# Patient Record
Sex: Female | Born: 1965 | Race: White | Hispanic: No | Marital: Married | Smoking: Never smoker
Health system: Southern US, Community
[De-identification: ages and names within clinical notes are randomized; demographics above are authoritative.]

## PROBLEM LIST (undated history)

## (undated) DIAGNOSIS — C4491 Basal cell carcinoma of skin, unspecified: Secondary | ICD-10-CM

## (undated) DIAGNOSIS — Z9889 Other specified postprocedural states: Secondary | ICD-10-CM

## (undated) DIAGNOSIS — F329 Major depressive disorder, single episode, unspecified: Secondary | ICD-10-CM

## (undated) DIAGNOSIS — R112 Nausea with vomiting, unspecified: Secondary | ICD-10-CM

## (undated) DIAGNOSIS — F32A Depression, unspecified: Secondary | ICD-10-CM

## (undated) DIAGNOSIS — F419 Anxiety disorder, unspecified: Secondary | ICD-10-CM

## (undated) HISTORY — PX: BLADDER REPAIR: SHX76

## (undated) HISTORY — DX: Anxiety disorder, unspecified: F41.9

## (undated) HISTORY — DX: Major depressive disorder, single episode, unspecified: F32.9

## (undated) HISTORY — PX: TUBAL LIGATION: SHX77

## (undated) HISTORY — DX: Depression, unspecified: F32.A

## (undated) HISTORY — PX: LAPAROSCOPIC OVARIAN CYSTECTOMY: SUR786

## (undated) HISTORY — PX: ABLATION: SHX5711

## (undated) SURGERY — Surgical Case
Anesthesia: *Unknown

---
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
## 2003-02-14 ENCOUNTER — Other Ambulatory Visit: Admission: RE | Admit: 2003-02-14 | Discharge: 2003-02-14 | Payer: Self-pay | Admitting: Obstetrics and Gynecology

## 2003-03-11 ENCOUNTER — Ambulatory Visit (HOSPITAL_BASED_OUTPATIENT_CLINIC_OR_DEPARTMENT_OTHER): Admission: RE | Admit: 2003-03-11 | Discharge: 2003-03-11 | Payer: Self-pay | Admitting: Obstetrics and Gynecology

## 2004-05-25 ENCOUNTER — Ambulatory Visit: Payer: Self-pay | Admitting: Internal Medicine

## 2004-07-11 ENCOUNTER — Other Ambulatory Visit: Admission: RE | Admit: 2004-07-11 | Discharge: 2004-07-11 | Payer: Self-pay | Admitting: Obstetrics and Gynecology

## 2004-07-31 ENCOUNTER — Encounter (INDEPENDENT_AMBULATORY_CARE_PROVIDER_SITE_OTHER): Payer: Self-pay | Admitting: Specialist

## 2004-07-31 ENCOUNTER — Ambulatory Visit (HOSPITAL_COMMUNITY): Admission: RE | Admit: 2004-07-31 | Discharge: 2004-07-31 | Payer: Self-pay | Admitting: Obstetrics and Gynecology

## 2004-10-03 ENCOUNTER — Ambulatory Visit: Payer: Self-pay | Admitting: Internal Medicine

## 2004-10-09 ENCOUNTER — Ambulatory Visit: Payer: Self-pay | Admitting: Internal Medicine

## 2005-03-14 ENCOUNTER — Ambulatory Visit: Payer: Self-pay | Admitting: Internal Medicine

## 2005-04-15 ENCOUNTER — Ambulatory Visit: Payer: Self-pay | Admitting: Internal Medicine

## 2006-04-09 ENCOUNTER — Encounter: Payer: Self-pay | Admitting: Internal Medicine

## 2008-07-25 ENCOUNTER — Ambulatory Visit: Payer: Self-pay | Admitting: Internal Medicine

## 2008-07-26 ENCOUNTER — Telehealth: Payer: Self-pay | Admitting: Internal Medicine

## 2009-04-05 ENCOUNTER — Ambulatory Visit: Payer: Self-pay | Admitting: Internal Medicine

## 2009-05-17 ENCOUNTER — Ambulatory Visit: Payer: Self-pay | Admitting: Internal Medicine

## 2009-05-18 ENCOUNTER — Encounter: Payer: Self-pay | Admitting: Internal Medicine

## 2009-05-22 ENCOUNTER — Telehealth: Payer: Self-pay | Admitting: Internal Medicine

## 2009-05-31 ENCOUNTER — Encounter: Payer: Self-pay | Admitting: Internal Medicine

## 2009-06-28 ENCOUNTER — Telehealth: Payer: Self-pay | Admitting: Internal Medicine

## 2009-08-15 LAB — HM MAMMOGRAPHY: HM Mammogram: NORMAL

## 2009-08-15 LAB — CONVERTED CEMR LAB: Pap Smear: NORMAL

## 2009-08-23 ENCOUNTER — Encounter: Payer: Self-pay | Admitting: Internal Medicine

## 2009-10-17 ENCOUNTER — Ambulatory Visit: Payer: Self-pay | Admitting: Internal Medicine

## 2010-03-16 ENCOUNTER — Ambulatory Visit: Payer: Self-pay | Admitting: Internal Medicine

## 2010-04-05 ENCOUNTER — Telehealth: Payer: Self-pay | Admitting: Internal Medicine

## 2010-04-13 ENCOUNTER — Ambulatory Visit: Payer: Self-pay | Admitting: Internal Medicine

## 2010-04-13 LAB — CONVERTED CEMR LAB
ALT: 12 units/L (ref 0–35)
AST: 15 units/L (ref 0–37)
Albumin: 4.2 g/dL (ref 3.5–5.2)
Alkaline Phosphatase: 50 units/L (ref 39–117)
BUN: 15 mg/dL (ref 6–23)
Basophils Absolute: 0 10*3/uL (ref 0.0–0.1)
Basophils Relative: 0.6 % (ref 0.0–3.0)
Bilirubin Urine: NEGATIVE
Bilirubin, Direct: 0 mg/dL (ref 0.0–0.3)
Blood in Urine, dipstick: NEGATIVE
CO2: 23 meq/L (ref 19–32)
Calcium: 8.9 mg/dL (ref 8.4–10.5)
Chloride: 111 meq/L (ref 96–112)
Cholesterol: 201 mg/dL — ABNORMAL HIGH (ref 0–200)
Creatinine, Ser: 0.8 mg/dL (ref 0.4–1.2)
Direct LDL: 135.1 mg/dL
Eosinophils Absolute: 0 10*3/uL (ref 0.0–0.7)
Eosinophils Relative: 1.1 % (ref 0.0–5.0)
GFR calc non Af Amer: 85.8 mL/min (ref >60)
Glucose, Bld: 78 mg/dL (ref 70–99)
Glucose, Urine, Semiquant: NEGATIVE
HCT: 35.9 % — ABNORMAL LOW (ref 36.0–46.0)
HDL: 47.7 mg/dL (ref >39.00)
Hemoglobin: 12.4 g/dL (ref 12.0–15.0)
Ketones, urine, test strip: NEGATIVE
Lymphocytes Relative: 36.6 % (ref 12.0–46.0)
Lymphs Abs: 1.6 10*3/uL (ref 0.7–4.0)
MCHC: 34.7 g/dL (ref 30.0–36.0)
MCV: 92.1 fL (ref 78.0–100.0)
Monocytes Absolute: 0.2 10*3/uL (ref 0.1–1.0)
Monocytes Relative: 4.8 % (ref 3.0–12.0)
Neutro Abs: 2.5 10*3/uL (ref 1.4–7.7)
Neutrophils Relative %: 56.9 % (ref 43.0–77.0)
Nitrite: NEGATIVE
Platelets: 303 10*3/uL (ref 150.0–400.0)
Potassium: 3.9 meq/L (ref 3.5–5.1)
Protein, U semiquant: NEGATIVE
RBC: 3.9 M/uL (ref 3.87–5.11)
RDW: 13.2 % (ref 11.5–14.6)
Sodium: 139 meq/L (ref 135–145)
Specific Gravity, Urine: 1.01
TSH: 1.58 microintl units/mL (ref 0.35–5.50)
Total Bilirubin: 0.6 mg/dL (ref 0.3–1.2)
Total CHOL/HDL Ratio: 4
Total Protein: 7 g/dL (ref 6.0–8.3)
Triglycerides: 63 mg/dL (ref 0.0–149.0)
Urobilinogen, UA: 0.2
VLDL: 12.6 mg/dL (ref 0.0–40.0)
WBC Urine, dipstick: NEGATIVE
WBC: 4.3 10*3/uL — ABNORMAL LOW (ref 4.5–10.5)
pH: 5.5

## 2010-04-20 ENCOUNTER — Ambulatory Visit: Payer: Self-pay | Admitting: Internal Medicine

## 2010-04-20 DIAGNOSIS — G43909 Migraine, unspecified, not intractable, without status migrainosus: Secondary | ICD-10-CM | POA: Insufficient documentation

## 2010-07-02 LAB — CBC
HCT: 36.8 % (ref 36.0–46.0)
Hemoglobin: 12.6 g/dL (ref 12.0–15.0)
MCH: 30.3 pg (ref 26.0–34.0)
MCHC: 34.2 g/dL (ref 30.0–36.0)
MCV: 88.5 fL (ref 78.0–100.0)
Platelets: 292 10*3/uL (ref 150–400)
RBC: 4.16 MIL/uL (ref 3.87–5.11)
RDW: 12.5 % (ref 11.5–15.5)
WBC: 6.8 10*3/uL (ref 4.0–10.5)

## 2010-07-03 ENCOUNTER — Ambulatory Visit (HOSPITAL_COMMUNITY)
Admission: RE | Admit: 2010-07-03 | Discharge: 2010-07-03 | Payer: Self-pay | Source: Home / Self Care | Attending: Obstetrics and Gynecology | Admitting: Obstetrics and Gynecology

## 2010-07-04 LAB — PREGNANCY, URINE: Preg Test, Ur: NEGATIVE

## 2010-07-17 NOTE — Assessment & Plan Note (Signed)
Summary: cpx/mm   Vital Signs:  Patient profile:   45 year old female Height:      63 inches Weight:      137 pounds Temp:     98.4 degrees F oral Pulse rate:   72 / minute Pulse rhythm:   regular BP sitting:   112 / 74  (left arm) Cuff size:   regular  Vitals Entered By: Alfred Levins, CMA (April 20, 2010 8:11 AM) CC: cpx no pap   CC:  cpx no pap.  History of Present Illness: CPX  Current Medications (verified): 1)  Maxalt 10 Mg  Tabs (Rizatriptan Benzoate) .... Once Daily As Needed Migraine 2)  Zyrtec Allergy 10 Mg Tabs (Cetirizine Hcl) .... Once Daily 3)  Topiramate 100 Mg Tabs (Topiramate) .Marland Kitchen.. 1 By Mouth Two Times A Day 4)  Elmiron 100 Mg Caps (Pentosan Polysulfate Sodium) .... As Directed Per Dr Dareen Piano 5)  Hydrocodone-Acetaminophen 5-325 Mg Tabs (Hydrocodone-Acetaminophen) .Marland Kitchen.. 1 By Mouth Up To 4 Times Per Day As Needed For Pain 6)  Treximet 85-500 Mg  Tabs (Sumatriptan-Naproxen Sodium) .Marland Kitchen.. 1 Once Daily As Needed Migraine  Allergies (verified): No Known Drug Allergies  Past History:  Past Medical History: Last updated: 03/19/2007 MHA Anxiety Depression Tiredness Headache  Past Surgical History: Last updated: 03/19/2007 Bladder Repair Tubal ligation  Family History: Last updated: 03/19/2007 Family History of MS Family Hsitory Headaches Family History Hypertension Family History of Melanoma Family History Thyroid disease  Social History: Last updated: 03/19/2007 Occupation: Married Never Smoked Alcohol use-yes  Risk Factors: Smoking Status: never (10/17/2009)  Physical Exam  General:  alert and well-developed.   Head:  normocephalic and atraumatic.   Eyes:  pupils equal and pupils round.   Ears:  R ear normal and L ear normal.   Neck:  No deformities, masses, or tenderness noted. Chest Wall:  No deformities, masses, or tenderness noted. Lungs:  Normal respiratory effort, chest expands symmetrically. Lungs are clear to auscultation,  no crackles or wheezes. Heart:  normal rate and regular rhythm.   Abdomen:  soft and non-tender.   Msk:  No deformity or scoliosis noted of thoracic or lumbar spine.   Pulses:  R radial normal and L radial normal.   Neurologic:  alert & oriented X3 and cranial nerves II-XII intact.   Skin:  turgor normal and color normal.   Psych:  good eye contact and not anxious appearing.     Impression & Recommendations:  Problem # 1:  PREVENTIVE HEALTH CARE (ICD-V70.0)  health maint utd continue excellent health habilts  Problem # 2:  MIGRAINE HEADACHE (ICD-346.90)  The following medications were removed from the medication list:    Maxalt 10 Mg Tabs (Rizatriptan benzoate) ..... Once daily as needed migraine Her updated medication list for this problem includes:    Hydrocodone-acetaminophen 5-325 Mg Tabs (Hydrocodone-acetaminophen) .Marland Kitchen... 1 by mouth up to 4 times per day as needed for pain    Treximet 85-500 Mg Tabs (Sumatriptan-naproxen sodium) .Marland Kitchen... 1 once daily as needed migraine  Complete Medication List: 1)  Zyrtec Allergy 10 Mg Tabs (Cetirizine hcl) .... Once daily 2)  Topiramate 100 Mg Tabs (Topiramate) .Marland Kitchen.. 1 by mouth two times a day 3)  Elmiron 100 Mg Caps (Pentosan polysulfate sodium) .... As directed per dr Dareen Piano 4)  Hydrocodone-acetaminophen 5-325 Mg Tabs (Hydrocodone-acetaminophen) .Marland Kitchen.. 1 by mouth up to 4 times per day as needed for pain 5)  Treximet 85-500 Mg Tabs (Sumatriptan-naproxen sodium) .Marland Kitchen.. 1 once daily as needed  migraine Prescriptions: TOPIRAMATE 100 MG TABS (TOPIRAMATE) 1 by mouth two times a day  #180 x 3   Entered and Authorized by:   Birdie Sons MD   Signed by:   Birdie Sons MD on 04/20/2010   Method used:   [facility]        [facility] [address] [phone]* (retail)       [address]       [place], [place]  [address]       Ph: [phone] or [phone]       Fax: [phone]   RxID:   [id number]    Orders Added: 1)  Est. Patient 40-64 years  [99396]     Preventive Care Screening  Mammogram:    Next Due:  08/2011  Pap Smear:    Date:  08/15/2009    Next Due:  08/2012    Results:  normal

## 2010-07-17 NOTE — Assessment & Plan Note (Signed)
Summary: 6 month rov/njr/pt rsc from bmp/cjr   Vital Signs:  Patient profile:   45 year old female Height:      64 inches Weight:      141 pounds BMI:     24.29 Temp:     98.5 degrees F oral Pulse rate:   62 / minute Pulse rhythm:   regular Resp:     12 per minute BP sitting:   108 / 68  (left arm) Cuff size:   regular  Vitals Entered By: Gladis Riffle, RN (Oct 17, 2009 1:16 PM) CC: 6 month rov Is Patient Diabetic? No   CC:  6 month rov.  History of Present Illness: migraines MUCH better on topomax has only had 2 migraines since being on meds relpax with good results All other systems reviewed and were negative and no side effects on meds   Preventive Screening-Counseling & Management  Alcohol-Tobacco     Smoking Status: never  Current Medications (verified): 1)  Relpax 40 Mg Tabs (Eletriptan Hydrobromide) .... One Tablet As Needed Migraine.  May Repeat Once After 2 Hours As Needed But Max of 2 Tablets 2)  Zyrtec Allergy 10 Mg Tabs (Cetirizine Hcl) .... Once Daily 3)  Topiramate 25 Mg Cpsp (Topiramate) .... Take 1 Tablet By Mouth Two Times A Day  Allergies (verified): No Known Drug Allergies  Past History:  Past Medical History: Last updated: 03/19/2007 MHA Anxiety Depression Tiredness Headache  Past Surgical History: Last updated: 03/19/2007 Bladder Repair Tubal ligation  Family History: Last updated: 03/19/2007 Family History of MS Family Hsitory Headaches Family History Hypertension Family History of Melanoma Family History Thyroid disease  Social History: Last updated: 03/19/2007 Occupation: Married Never Smoked Alcohol use-yes  Risk Factors: Smoking Status: never (10/17/2009)  Physical Exam  General:  alert and well-developed.   Head:  atraumatic.   Eyes:  pupils equal and pupils round.   Neck:  No deformities, masses, or tenderness noted. Lungs:  Normal respiratory effort, chest expands symmetrically. Lungs are clear to  auscultation, no crackles or wheezes.   Impression & Recommendations:  Problem # 1:  HEADACHE (ICD-784.0)  migraines much better on topomax continue current medications  Her updated medication list for this problem includes:    Relpax 40 Mg Tabs (Eletriptan hydrobromide) ..... One tablet as needed migraine.  may repeat once after 2 hours as needed but max of 2 tablets  Her updated medication list for this problem includes:    Relpax 40 Mg Tabs (Eletriptan hydrobromide) ..... One tablet as needed migraine.  may repeat once after 2 hours as needed but max of 2 tablets  Complete Medication List: 1)  Relpax 40 Mg Tabs (Eletriptan hydrobromide) .... One tablet as needed migraine.  may repeat once after 2 hours as needed but max of 2 tablets 2)  Zyrtec Allergy 10 Mg Tabs (Cetirizine hcl) .... Once daily 3)  Topiramate 25 Mg Cpsp (Topiramate) .... Take 1 tablet by mouth two times a day  Other Orders: Tdap => 61yrs IM (01601) Admin 1st Vaccine (09323)  Patient Instructions: 1)  Please schedule a follow-up appointment in 6 months. CPX   Immunizations Administered:  Tetanus Vaccine:    Vaccine Type: Tdap    Site: left deltoid    Mfr: GlaxoSmithKline    Dose: 0.5 ml    Route: IM    Given by: Gladis Riffle, RN    Exp. Date: 09/09/2011    Lot #: [id number]

## 2010-07-17 NOTE — Assessment & Plan Note (Signed)
Summary: MEDICATION CONCERNS // RS   Vital Signs:  Patient profile:   45 year old female Weight:      135 pounds Temp:     98.2 degrees F oral BP sitting:   102 / 72  (left arm) Cuff size:   regular  Vitals Entered By: Sid Falcon LPN (March 16, 2010 12:16 PM)  History of Present Illness: headaches---hx of migraines . Left sided---eye to occipital area nause/vomiting photophobia  started topomax---great results  Allergies (verified): No Known Drug Allergies  Past History:  Past Medical History: Last updated: 03/19/2007 MHA Anxiety Depression Tiredness Headache  Past Surgical History: Last updated: 03/19/2007 Bladder Repair Tubal ligation  Family History: Last updated: 03/19/2007 Family History of MS Family Hsitory Headaches Family History Hypertension Family History of Melanoma Family History Thyroid disease  Social History: Last updated: 03/19/2007 Occupation: Married Never Smoked Alcohol use-yes  Risk Factors: Smoking Status: never (10/17/2009)  Physical Exam  General:  well-developed well-nourished female in no acute distress. HEENT exam atraumatic, normocephalic symmetric muscles are intact. Neck is supple without lymphadenopathy, thyromegaly, jugular venous distention or carotid bruits. Chest is clear is a patient with increased work of breathing. Cardiac exam S1-S2 normal without murmurs or gallops. Abdominal exam active bowel sounds, soft and nontender there is no hepatomegaly no masses are palpated. Extremities no clubbing cyanosis or edema.  Ocular exam fundi appear normal.   Impression & Recommendations:  Problem # 1:  HEADACHE (ICD-784.0) relpax not working I have given her samples of combination medication including Imitrex, and the proximal and. Side effects discussed. I've also given her samples of Maxalt with specific instructions. She will call me if either of these work and I'll be happy to call in medications. Side effects of  hydrocodone discussed. Increase dose of Topamax. Side effects discussed. Her updated medication list for this problem includes:    Relpax 40 Mg Tabs (Eletriptan hydrobromide) ..... One tablet as needed migraine.  may repeat once after 2 hours as needed but max of 2 tablets    Hydrocodone-acetaminophen 5-325 Mg Tabs (Hydrocodone-acetaminophen) .Marland Kitchen... 1 by mouth up to 4 times per day as needed for pain  Complete Medication List: 1)  Relpax 40 Mg Tabs (Eletriptan hydrobromide) .... One tablet as needed migraine.  may repeat once after 2 hours as needed but max of 2 tablets 2)  Zyrtec Allergy 10 Mg Tabs (Cetirizine hcl) .... Once daily 3)  Topiramate 100 Mg Tabs (Topiramate) .Marland Kitchen.. 1 by mouth two times a day 4)  Elmiron 100 Mg Caps (Pentosan polysulfate sodium) .... As directed per dr Dareen Piano 5)  Hydrocodone-acetaminophen 5-325 Mg Tabs (Hydrocodone-acetaminophen) .Marland Kitchen.. 1 by mouth up to 4 times per day as needed for pain  Patient Instructions: 1)  topiramate 50 mg two times a day for 10 days increase to 100 mg by mouth two times a day if necessary  Contraindications/Deferment of Procedures/Staging:    Test/Procedure: FLU VAX    Reason for deferment: patient declined  Prescriptions: HYDROCODONE-ACETAMINOPHEN 5-325 MG TABS (HYDROCODONE-ACETAMINOPHEN) 1 by mouth up to 4 times per day as needed for pain  #20 x 1   Entered and Authorized by:   Birdie Sons MD   Signed by:   Birdie Sons MD on 03/16/2010   Method used:   Print then Give to Patient   RxID:   [id number] TOPIRAMATE 100 MG TABS (TOPIRAMATE) 1 by mouth two times a day  #60 x 3   Entered and Authorized by:   Birdie Sons MD  Signed by:   Birdie Sons MD on 03/16/2010   Method used:   [facility] to        [facility] [phone]* (retail)       [address]       [place], [place]  [address]       Ph: [phone] or [phone]       Fax: [phone]   RxID:   [id number]   Preventive Care Screening  Mammogram:     Date:  08/15/2009    Results:  normal

## 2010-07-17 NOTE — Consult Note (Signed)
Summary: [facility]   Imported By: Sherian Rein 11/25/2009 08:38:23  _____________________________________________________________________  External Attachment:    Type:   Image     Comment:   External Document

## 2010-07-17 NOTE — Progress Notes (Signed)
Summary: weaning off Topamax  Phone Note Call from Patient   Caller: Patient Call For: Birdie Sons MD Summary of Call: Pt is having lower back pain with hematuria and seeing URO today.  Thinks it is the Topamax and is weaning off of it.  Would like different RX for migraines.  [phone]   [phone] [facility]....[phone] Initial call taken by: Lynann Beaver CMA,  June 28, 2009 11:15 AM

## 2010-07-17 NOTE — Progress Notes (Signed)
Summary: new rx needed  Phone Note Call from Patient Call back at [phone]   Caller: Patient---live call Summary of Call: pt need a new rx for Topamax 100 mg two times a day . needs 90 day supply with 3 rfs....[facility] [place]. was told to increase her dosage. Initial call taken by: Warnell Forester,  April 05, 2010 10:29 AM    Prescriptions: TOPIRAMATE 100 MG TABS (TOPIRAMATE) 1 by mouth two times a day  #90 x 3   Entered by:   Lynann Beaver CMA   Authorized by:   Birdie Sons MD   Signed by:   Lynann Beaver CMA on 04/05/2010   Method used:   Electronically to        [facility] [id number]* (retail)       [address]       [place], [place]  [address]       Ph: [phone] or [phone]       Fax: [phone]   RxID:   [id number]

## 2010-07-18 NOTE — Op Note (Signed)
  NAMEERCIE, Barbara Maldonado                ACCOUNT NO.:  [id number]  MEDICAL RECORD NO.:  [id number]          PATIENT TYPE:  AMB  LOCATION:  SDC                           [facility]  PHYSICIAN:  Malva Limes, M.D.    DATE OF BIRTH:  [birth year]  DATE OF PROCEDURE:  07/03/2010 DATE OF DISCHARGE:                              OPERATIVE REPORT   PREOPERATIVE DIAGNOSIS:  Endocervical polyp seen on ultrasound.  POSTOPERATIVE DIAGNOSIS:  Endocervical polyp seen on ultrasound.  PROCEDURE: 1. Hysteroscopy. 2. Endocervical polypectomy.  SURGEON:  Malva Limes, MD  ASSISTANT:  None.  ANESTHESIA:  General.  ANTIBIOTIC:  Ancef 1 g.  DRAINS:  Red rubber catheter bladder.  SPECIMENS:  Endocervical polyp and curettings sent to Pathology.  COMPLICATIONS:  None.  ESTIMATED BLOOD LOSS:  20 mL.  DESCRIPTION OF PROCEDURE:  The patient was taken to the operating room where she was placed in dorsal supine position and general anesthetic was administered difficulty.  She was then placed in dorsal lithotomy position.  She was prepped and draped in the usual fashion for this procedure.  A 10 mL of 1% lidocaine was used for paracervical block. The cervix was then serially dilated to a 25-French.  The hysteroscope was advanced through the endocervical canal where a 1-1-1/2-cm polyp was visualized, arising from approximately the 7 o'clock position.  On entering the uterine cavity, the patient had evidence of past NovaSure with scarring and attenuated uterine cavity.  At this point, the hysteroscope was removed and polyp forceps was used to remove the polyp. A sharp curettage was then performed throughout the endocervical canal. Hysteroscope was then passed again and it appeared the polyp was removed.  This concluded the procedure.  The patient was taken to the recovery room in stable condition.  She will be discharged home.  She will be sent home with Motrin to take p.r.n.  She will follow up in  the office in 4 weeks.          ______________________________ Malva Limes, M.D.     MA/MEDQ  D:  07/03/2010  T:  07/03/2010  Job:  [id number]  Electronically Signed by Malva Limes M.D. on 07/18/2010 08:32:07 AM

## 2010-10-31 ENCOUNTER — Encounter: Payer: Self-pay | Admitting: Internal Medicine

## 2010-11-02 ENCOUNTER — Ambulatory Visit (INDEPENDENT_AMBULATORY_CARE_PROVIDER_SITE_OTHER): Payer: 59 | Admitting: Internal Medicine

## 2010-11-02 ENCOUNTER — Encounter: Payer: Self-pay | Admitting: Internal Medicine

## 2010-11-02 DIAGNOSIS — R109 Unspecified abdominal pain: Secondary | ICD-10-CM

## 2010-11-02 LAB — CBC WITH DIFFERENTIAL/PLATELET
Basophils Absolute: 0 10*3/uL (ref 0.0–0.1)
Basophils Relative: 0.6 % (ref 0.0–3.0)
Eosinophils Absolute: 0.1 10*3/uL (ref 0.0–0.7)
Eosinophils Relative: 0.9 % (ref 0.0–5.0)
HCT: 38.5 % (ref 36.0–46.0)
Hemoglobin: 13.1 g/dL (ref 12.0–15.0)
Lymphocytes Relative: 32.1 % (ref 12.0–46.0)
Lymphs Abs: 2 10*3/uL (ref 0.7–4.0)
MCHC: 33.9 g/dL (ref 30.0–36.0)
MCV: 92.5 fl (ref 78.0–100.0)
Monocytes Absolute: 0.3 10*3/uL (ref 0.1–1.0)
Monocytes Relative: 5.2 % (ref 3.0–12.0)
Neutro Abs: 3.8 10*3/uL (ref 1.4–7.7)
Neutrophils Relative %: 61.2 % (ref 43.0–77.0)
Platelets: 317 10*3/uL (ref 150.0–400.0)
RBC: 4.17 Mil/uL (ref 3.87–5.11)
RDW: 13 % (ref 11.5–14.6)
WBC: 6.2 10*3/uL (ref 4.5–10.5)

## 2010-11-02 LAB — BASIC METABOLIC PANEL
BUN: 14 mg/dL (ref 6–23)
CO2: 22 mEq/L (ref 19–32)
Calcium: 9.3 mg/dL (ref 8.4–10.5)
Chloride: 112 mEq/L (ref 96–112)
Creatinine, Ser: 0.9 mg/dL (ref 0.4–1.2)
GFR: 71.63 mL/min (ref >60.00)
Glucose, Bld: 64 mg/dL — ABNORMAL LOW (ref 70–99)
Potassium: 4.8 mEq/L (ref 3.5–5.1)
Sodium: 140 mEq/L (ref 135–145)

## 2010-11-02 LAB — HEPATIC FUNCTION PANEL
ALT: 12 U/L (ref 0–35)
AST: 15 U/L (ref 0–37)
Albumin: 4.1 g/dL (ref 3.5–5.2)
Alkaline Phosphatase: 44 U/L (ref 39–117)
Bilirubin, Direct: 0.1 mg/dL (ref 0.0–0.3)
Total Bilirubin: 0.1 mg/dL — ABNORMAL LOW (ref 0.3–1.2)
Total Protein: 6.8 g/dL (ref 6.0–8.3)

## 2010-11-02 NOTE — Progress Notes (Signed)
  Subjective:    Patient ID: Barbara Maldonado, female    DOB: 1966/06/11, 45 y.o.   MRN: [id number]  HPI  She describes postprandial pain associated with nausea. Also epigastric pain that radiates bilaterally. Symptoms have been ongoing for 2 months. Pain can be severe. She has not had any emesis.  Second complaint she has some left-sided flank discomfort. This seems to be mechanical. If he changes position the flank discomfort is relieved quickly. She denies any known hematuria.  Past Medical History  Diagnosis Date  . Migraine   . Anxiety   . Depression    Past Surgical History  Procedure Date  . Bladder repair   . Tubal ligation     reports that she has never smoked. She does not have any smokeless tobacco history on file. She reports that she drinks alcohol. Her drug history not on file. family history is not on file. No Known Allergies   Review of Systems     patient denies chest pain, shortness of breath, orthopnea. Denies lower extremity edema, abdominal pain, change in appetite, change in bowel movements. Patient denies rashes, musculoskeletal complaints. No other specific complaints in a complete review of systems.   Objective:   Physical Exam  Well-developed well-nourished female in no acute distress. HEENT exam atraumatic, normocephalic, extraocular muscles are intact. Neck is supple. No jugular venous distention no thyromegaly. Chest clear to auscultation without increased work of breathing. Cardiac exam S1 and S2 are regular. Abdominal exam active bowel sounds, soft, nontender. Extremities no edema. Neurologic exam she is alert without any motor sensory deficits. Gait is normal.        Assessment & Plan:  Abdominal pain. I think this is likely related to her gallbladder. I think she needs further evaluation. I will check laboratory work today we'll check a ultrasound today. She will be contacted after the results of these tests are known. She understands the evaluation  and workup.

## 2010-11-02 NOTE — Op Note (Signed)
NAMELUCIANN, GOSSETT                ACCOUNT NO.:  [id number]   MEDICAL RECORD NO.:  [id number]          PATIENT TYPE:  AMB   LOCATION:  SDC                           [facility]   PHYSICIAN:  Malva Limes, M.D.    DATE OF BIRTH:  1966/05/03   DATE OF PROCEDURE:  07/31/2004  DATE OF DISCHARGE:                                 OPERATIVE REPORT   PREOPERATIVE DIAGNOSIS:  Menorrhagia.   POSTOPERATIVE DIAGNOSIS:  Menorrhagia.   PROCEDURES:  1.  Dilation and curettage.  2.  Novasure endometrial ablation.   SURGEON:  Malva Limes, M.D.   ANESTHESIA:  MAC with paracervical block.   SPECIMENS:  Endometrial curettings.   ANTIBIOTICS:  Ancef 1 g.   COMPLICATIONS:  None.   DRAINS:  None.   PROCEDURE IN DETAIL:  The patient was taken to the operating room where she  was placed in the dorsalis lithotomy position.  She was prepped with  Hibiclens and draped in the usual fashion for this procedure.  An exam under  anesthesia was then performed which revealed an anteverted uterus of normal  size and shape.  A sterile speculum was then placed into the uterus, 20 mL  of 1% lidocaine was used for paracervical block.  A single-tooth tenaculum  was then applied to the anterior cervical lip.  The uterus was then sounded  to 9 cm.  The cervical os was then serially dilated to a 27 [place].  The  endocervical canal was measured at 3.5 cm giving an endometrial cavity  length of 5.5 cm.   At this point, sharp curettage was then performed.  The Novasure device was  then placed into the uterine cavity, opened, and __________ procedure  performed.  The width was 4.3 cm.  The seal test was then performed and  passed. At this point, the device was turned on for a total of 1 minute and  59 seconds.  The power was 130 watts.  The patient tolerated the procedure well.  She was taken to recovery room in  stable condition.  Instrument and lap counts were correct x1.  The patient  will be discharged to  home.  She will be sent home with Vicodin to take  p.r.n.  She will follow up in the office in 4 weeks.      MA/MEDQ  D:  07/31/2004  T:  07/31/2004  Job:  [id number]

## 2010-11-06 ENCOUNTER — Other Ambulatory Visit: Payer: Self-pay | Admitting: Internal Medicine

## 2010-11-09 ENCOUNTER — Other Ambulatory Visit: Payer: 59

## 2010-11-16 ENCOUNTER — Ambulatory Visit
Admission: RE | Admit: 2010-11-16 | Discharge: 2010-11-16 | Disposition: A | Discharge status: Home / Self Care | Payer: 59 | Source: Ambulatory Visit | Attending: Internal Medicine | Admitting: Internal Medicine

## 2010-11-21 ENCOUNTER — Telehealth: Payer: Self-pay | Admitting: *Deleted

## 2010-11-21 NOTE — Telephone Encounter (Signed)
Pt requesting abdominal u/s results from last Friday.

## 2010-11-22 ENCOUNTER — Other Ambulatory Visit: Payer: Self-pay | Admitting: Internal Medicine

## 2010-11-22 DIAGNOSIS — N2889 Other specified disorders of kidney and ureter: Secondary | ICD-10-CM
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
## 2010-11-22 NOTE — Telephone Encounter (Signed)
Call patient. 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 This probably needs to be further evaluated just to be sure it isn't anything else. Schedule CT of the abdomen and pelvis with and without contrast.

## 2010-11-23 ENCOUNTER — Other Ambulatory Visit (INDEPENDENT_AMBULATORY_CARE_PROVIDER_SITE_OTHER): Payer: 59

## 2010-11-23 DIAGNOSIS — Z1329 Encounter for screening for other suspected endocrine disorder: Secondary | ICD-10-CM

## 2010-11-23 LAB — TSH: TSH: 1.38 u[IU]/mL (ref 0.35–5.50)

## 2010-11-29 ENCOUNTER — Ambulatory Visit (INDEPENDENT_AMBULATORY_CARE_PROVIDER_SITE_OTHER)
Admission: RE | Admit: 2010-11-29 | Discharge: 2010-11-29 | Disposition: A | Discharge status: Home / Self Care | Payer: 59 | Source: Ambulatory Visit | Attending: Internal Medicine | Admitting: Internal Medicine

## 2010-11-29 DIAGNOSIS — N2889 Other specified disorders of kidney and ureter: Secondary | ICD-10-CM

## 2010-11-29 DIAGNOSIS — N289 Disorder of kidney and ureter, unspecified: Secondary | ICD-10-CM

## 2010-11-29 MED ORDER — IOHEXOL 300 MG/ML  SOLN
100.0000 mL | Freq: Once | INTRAMUSCULAR | Status: AC | PRN
  Administered 2010-11-29: 100 mL via INTRAVENOUS

## 2010-11-30 ENCOUNTER — Telehealth: Payer: Self-pay | Admitting: *Deleted

## 2010-11-30 DIAGNOSIS — M549 Dorsalgia, unspecified: Secondary | ICD-10-CM

## 2010-11-30 NOTE — Telephone Encounter (Signed)
Most likely mechanical problem Refer to PT

## 2010-11-30 NOTE — Telephone Encounter (Signed)
Gave pt CT results and she would like to know what could be causing the back pain since CT was negative for a renal mass and what can she do about it

## 2010-11-30 NOTE — Telephone Encounter (Signed)
LMTCB

## 2010-12-03 ENCOUNTER — Telehealth: Payer: Self-pay | Admitting: *Deleted

## 2010-12-03 NOTE — Telephone Encounter (Signed)
Pt does not want to have PT right now.

## 2010-12-12 ENCOUNTER — Ambulatory Visit: Payer: 59 | Admitting: Internal Medicine

## 2010-12-13 ENCOUNTER — Ambulatory Visit (INDEPENDENT_AMBULATORY_CARE_PROVIDER_SITE_OTHER): Payer: 59 | Admitting: Internal Medicine

## 2010-12-13 ENCOUNTER — Encounter: Payer: Self-pay | Admitting: Internal Medicine

## 2010-12-13 VITALS — BP 102/64 | Temp 98.6°F | Wt 124.0 lb

## 2010-12-13 DIAGNOSIS — R6889 Other general symptoms and signs: Secondary | ICD-10-CM

## 2010-12-13 DIAGNOSIS — R599 Enlarged lymph nodes, unspecified: Secondary | ICD-10-CM

## 2010-12-13 DIAGNOSIS — R591 Generalized enlarged lymph nodes: Secondary | ICD-10-CM

## 2010-12-13 LAB — T4, FREE: Free T4: 0.92 ng/dL (ref 0.60–1.60)

## 2010-12-13 LAB — T3, FREE: T3, Free: 2.8 pg/mL (ref 2.3–4.2)

## 2010-12-16 NOTE — Progress Notes (Signed)
  Subjective:    Patient ID: Barbara Maldonado, female    DOB: 11/28/1965, 45 y.o.   MRN: [id number]  HPI  Patient comes in for followup. Patient has had several medical procedures which seem to lead to further, unrelated medical issues. As a brief review she came in with left-sided flank discomfort. She was sent for an ultrasound. Ultrasound demonstrated some sort of renal mass. She was sent for a CT scan that did not reveal any significant findings related to the left kidney but no seizure discuss the possibility of CAT scan identified inguinal lymphadenopathy. She doesn't feel any mass in the area. She denies any fevers or chills. She denies any recent injury to the area. She is otherwise feeling well.  Past Medical History  Diagnosis Date  . Migraine   . Anxiety   . Depression    Past Surgical History  Procedure Date  . Bladder repair   . Tubal ligation     reports that she has never smoked. She does not have any smokeless tobacco history on file. She reports that she drinks alcohol. Her drug history not on file. family history is not on file. No Known Allergies   Review of Systems     patient denies chest pain, shortness of breath, orthopnea. Denies lower extremity edema, abdominal pain, change in appetite, change in bowel movements. Patient denies rashes, musculoskeletal complaints. No other specific complaints in a complete review of systems.   Objective:   Physical Exam Well-developed female in no acute distress. Neck is supple without lymphadenopathy. There is no axillary adenopathy. Chest clear to auscultation abdomen; soft, soft. No masses palpated. I'm unable to palpate any significant inguinal adenopathy.     Assessment & Plan:  Patient with CAT scan identified inguinal lymphadenopathy. I think she needs further evaluation. Also for an ultrasound.

## 2010-12-17 ENCOUNTER — Other Ambulatory Visit: Payer: Self-pay | Admitting: Internal Medicine

## 2010-12-17 DIAGNOSIS — L659 Nonscarring hair loss, unspecified: Secondary | ICD-10-CM

## 2010-12-18 ENCOUNTER — Other Ambulatory Visit: Payer: Self-pay | Admitting: Internal Medicine

## 2010-12-18 ENCOUNTER — Ambulatory Visit
Admission: RE | Admit: 2010-12-18 | Discharge: 2010-12-18 | Disposition: A | Discharge status: Home / Self Care | Payer: 59 | Source: Ambulatory Visit | Attending: Internal Medicine | Admitting: Internal Medicine

## 2010-12-18 DIAGNOSIS — R591 Generalized enlarged lymph nodes: Secondary | ICD-10-CM

## 2010-12-21 ENCOUNTER — Telehealth: Payer: Self-pay | Admitting: *Deleted

## 2010-12-21 NOTE — Telephone Encounter (Signed)
Pt wants u/s results

## 2010-12-25 NOTE — Telephone Encounter (Signed)
Pt aware of results 

## 2011-01-11 ENCOUNTER — Ambulatory Visit: Payer: 59 | Admitting: Internal Medicine

## 2011-01-28 ENCOUNTER — Encounter: Payer: Self-pay | Admitting: Family Medicine

## 2011-01-28 ENCOUNTER — Ambulatory Visit (INDEPENDENT_AMBULATORY_CARE_PROVIDER_SITE_OTHER): Payer: 59 | Admitting: Family Medicine

## 2011-01-28 VITALS — BP 120/80 | Temp 98.2°F | Wt 124.0 lb

## 2011-01-28 DIAGNOSIS — F329 Major depressive disorder, single episode, unspecified: Secondary | ICD-10-CM

## 2011-01-28 DIAGNOSIS — F3289 Other specified depressive episodes: Secondary | ICD-10-CM

## 2011-01-28 MED ORDER — FLUOXETINE HCL 20 MG PO CAPS: 20.0000 mg | ORAL_CAPSULE | Freq: Every day | ORAL | Status: DC

## 2011-01-28 NOTE — Progress Notes (Signed)
  Subjective:    Patient ID: Barbara Maldonado, female    DOB: 15-Aug-1965, 45 y.o.   MRN: [id number]  HPI Here with depression concerns. Increased stress issues with her oldest daughter making several poor choices. Patient's had several months of progressive crying spells. She has decreased motivation. Difficulty getting out of bed. Difficulty focusing. More sleep than usual. Appetite is stable. No weight changes. Still exercising regularly without improvement. No active suicidal ideation. History of depression about 10 years ago treated with Wellbutrin. She did not like the way that made her feel. She has history of migraine headaches treated with Topamax.  Recent thyroid function normal.  Migraines stable.  Past Medical History  Diagnosis Date  . Migraine   . Anxiety   . Depression    Past Surgical History  Procedure Date  . Bladder repair   . Tubal ligation     reports that she has never smoked. She does not have any smokeless tobacco history on file. She reports that she drinks alcohol. Her drug history not on file. family history is not on file. No Known Allergies    Review of Systems  Constitutional: Negative for activity change, appetite change and unexpected weight change.  Respiratory: Negative for shortness of breath.   Cardiovascular: Negative for chest pain.  Neurological: Negative for dizziness, syncope, weakness and headaches.  Psychiatric/Behavioral: Positive for dysphoric mood. Negative for confusion, sleep disturbance and agitation.       Objective:   Physical Exam  Constitutional: She is oriented to person, place, and time. She appears well-developed and well-nourished.  Eyes: Pupils are equal, round, and reactive to light.  Neck: Neck supple. No thyromegaly present.  Cardiovascular: Normal rate, regular rhythm and normal heart sounds.   No murmur heard. Pulmonary/Chest: Effort normal and breath sounds normal. No respiratory distress. She has no wheezes. She has  no rales.  Musculoskeletal: She exhibits no edema.  Lymphadenopathy:    She has no cervical adenopathy.  Neurological: She is alert and oriented to person, place, and time. No cranial nerve deficit.  Psychiatric: Her behavior is normal. Judgment and thought content normal.       Depressed mood.          Assessment & Plan:  Maj. depressive episode. Discussed exercise and offered counseling. Given progression of symptoms over month start fluoxetine 20 mg per day and followup with primary in 3 weeks to reassess. No active suicidal ideation.

## 2011-02-19 ENCOUNTER — Ambulatory Visit (INDEPENDENT_AMBULATORY_CARE_PROVIDER_SITE_OTHER): Payer: 59 | Admitting: Family Medicine

## 2011-02-19 ENCOUNTER — Encounter: Payer: Self-pay | Admitting: Family Medicine

## 2011-02-19 VITALS — BP 90/62 | Temp 98.5°F | Wt 123.0 lb

## 2011-02-19 DIAGNOSIS — F329 Major depressive disorder, single episode, unspecified: Secondary | ICD-10-CM

## 2011-02-19 MED ORDER — FLUOXETINE HCL 20 MG PO CAPS: 20.0000 mg | ORAL_CAPSULE | Freq: Every day | ORAL | Status: DC

## 2011-02-19 NOTE — Progress Notes (Signed)
  Subjective:    Patient ID: Barbara Maldonado, female    DOB: 11/28/1965, 45 y.o.   MRN: [id number]  HPI Followup depression. No side affects from fluoxetine. Overall feels much better. Increased motivation. Less depressed mood. No suicidal ideation. Still exercising regularly. Has had possibly some slight increased fatigue which she thinks may be secondary to medication. No nausea. Improved ability to focus. No recent migraine headaches.   Review of Systems  Constitutional: Negative for appetite change and unexpected weight change.  Cardiovascular: Negative for chest pain.  Neurological: Negative for dizziness, weakness and headaches.  Psychiatric/Behavioral: Negative for confusion and agitation. The patient is not nervous/anxious.        Objective:   Physical Exam  Constitutional: She is oriented to person, place, and time. She appears well-developed and well-nourished. No distress.  Cardiovascular: Normal rate, regular rhythm and normal heart sounds.   Pulmonary/Chest: Effort normal and breath sounds normal. No respiratory distress. She has no wheezes. She has no rales.  Musculoskeletal: She exhibits no edema.  Neurological: She is alert and oriented to person, place, and time.          Assessment & Plan:  Maj. depressive episode. Improved. Continue fluoxetine for a minimum of 6-9 months. Continue regular exercise.

## 2011-02-19 NOTE — Patient Instructions (Signed)
Consider taking fluoxetine for at least 6-9 months.

## 2011-04-25 ENCOUNTER — Other Ambulatory Visit: Payer: Self-pay | Admitting: Obstetrics and Gynecology

## 2011-05-06 ENCOUNTER — Other Ambulatory Visit: Payer: Self-pay | Admitting: Internal Medicine

## 2012-03-03 ENCOUNTER — Other Ambulatory Visit: Payer: Self-pay | Admitting: Family Medicine

## 2012-05-18 ENCOUNTER — Other Ambulatory Visit: Payer: Self-pay | Admitting: Internal Medicine

## 2012-05-19 ENCOUNTER — Other Ambulatory Visit: Payer: Self-pay | Admitting: Internal Medicine

## 2012-05-25 ENCOUNTER — Other Ambulatory Visit: Payer: Self-pay | Admitting: Internal Medicine

## 2012-05-26 ENCOUNTER — Ambulatory Visit (INDEPENDENT_AMBULATORY_CARE_PROVIDER_SITE_OTHER): Payer: 59 | Admitting: Family

## 2012-05-26 ENCOUNTER — Encounter: Payer: Self-pay | Admitting: Family

## 2012-05-26 VITALS — BP 100/64 | Temp 98.5°F | Wt 125.5 lb

## 2012-05-26 DIAGNOSIS — J45909 Unspecified asthma, uncomplicated: Secondary | ICD-10-CM

## 2012-05-26 DIAGNOSIS — G43909 Migraine, unspecified, not intractable, without status migrainosus: Secondary | ICD-10-CM

## 2012-05-26 DIAGNOSIS — A6000 Herpesviral infection of urogenital system, unspecified: Secondary | ICD-10-CM | POA: Insufficient documentation

## 2012-05-26 DIAGNOSIS — J309 Allergic rhinitis, unspecified: Secondary | ICD-10-CM | POA: Insufficient documentation

## 2012-05-26 MED ORDER — TOPIRAMATE 100 MG PO TABS: 100.0000 mg | ORAL_TABLET | Freq: Two times a day (BID) | ORAL | Status: DC

## 2012-05-26 NOTE — Patient Instructions (Signed)
Migraine Headache A migraine headache is an intense, throbbing pain on one or both sides of your head. A migraine can last for 30 minutes to several hours. CAUSES  The exact cause of a migraine headache is not always known. However, a migraine may be caused when nerves in the brain become irritated and release chemicals that cause inflammation. This causes pain. SYMPTOMS  Pain on one or both sides of your head.  Pulsating or throbbing pain.  Severe pain that prevents daily activities.  Pain that is aggravated by any physical activity.  Nausea, vomiting, or both.  Dizziness.  Pain with exposure to bright lights, loud noises, or activity.  General sensitivity to bright lights, loud noises, or smells. Before you get a migraine, you may get warning signs that a migraine is coming (aura). An aura may include:  Seeing flashing lights.  Seeing bright spots, halos, or zig-zag lines.  Having tunnel vision or blurred vision.  Having feelings of numbness or tingling.  Having trouble talking.  Having muscle weakness. MIGRAINE TRIGGERS  Alcohol.  Smoking.  Stress.  Menstruation.  Aged cheeses.  Foods or drinks that contain nitrates, glutamate, aspartame, or tyramine.  Lack of sleep.  Chocolate.  Caffeine.  Hunger.  Physical exertion.  Fatigue.  Medicines used to treat chest pain (nitroglycerine), birth control pills, estrogen, and some blood pressure medicines. DIAGNOSIS  A migraine headache is often diagnosed based on:  Symptoms.  Physical examination.  A CT scan or MRI of your head. TREATMENT Medicines may be given for pain and nausea. Medicines can also be given to help prevent recurrent migraines.  HOME CARE INSTRUCTIONS  Only take over-the-counter or prescription medicines for pain or discomfort as directed by your caregiver. The use of long-term narcotics is not recommended.  Lie down in a dark, quiet room when you have a migraine.  Keep a journal  to find out what may trigger your migraine headaches. For example, write down:  What you eat and drink.  How much sleep you get.  Any change to your diet or medicines.  Limit alcohol consumption.  Quit smoking if you smoke.  Get 7 to 9 hours of sleep, or as recommended by your caregiver.  Limit stress.  Keep lights dim if bright lights bother you and make your migraines worse. SEEK IMMEDIATE MEDICAL CARE IF:   Your migraine becomes severe.  You have a fever.  You have a stiff neck.  You have vision loss.  You have muscular weakness or loss of muscle control.  You start losing your balance or have trouble walking.  You feel faint or pass out.  You have severe symptoms that are different from your first symptoms. MAKE SURE YOU:   Understand these instructions.  Will watch your condition.  Will get help right away if you are not doing well or get worse. Document Released: 06/03/2005 Document Revised: 08/26/2011 Document Reviewed: 05/24/2011 Exit[employer] Patient Information 2013 [employer].  

## 2012-05-26 NOTE — Progress Notes (Signed)
Subjective:    Patient ID: Barbara Maldonado, female    DOB: 1966-02-28, 46 y.o.   MRN: [id number]  HPI 46 year old white female, nonsmoker, patient of Dr. Caryl Never is in for a recheck of migraine headaches. She currently takes Topamax 100 mg twice a day as a preventative for headaches. Medications working very well. She has about one headache a month that responds well to approximate. Denies any lightheadedness, dizziness, chest pain, palpitations, shortness of breath or edema.  Patient sees gynecology for physicals and Pap smears. She had a mammogram last month.   Review of Systems  Constitutional: Negative.   HENT: Negative.   Eyes: Negative.   Respiratory: Negative.   Cardiovascular: Negative.   Gastrointestinal: Negative.   Genitourinary: Negative.   Musculoskeletal: Negative.   Skin: Negative.   Neurological: Negative.   Hematological: Negative.   Psychiatric/Behavioral: Negative.    Past Medical History  Diagnosis Date  . Migraine   . Anxiety   . Depression     History   Social History  . Marital Status: Married    Spouse Name: N/A    Number of Children: N/A  . Years of Education: N/A   Occupational History  . Not on file.   Social History Main Topics  . Smoking status: Never Smoker   . Smokeless tobacco: Not on file  . Alcohol Use: Yes  . Drug Use:   . Sexually Active:    Other Topics Concern  . Not on file   Social History Narrative  . No narrative on file    Past Surgical History  Procedure Date  . Bladder repair   . Tubal ligation     No family history on file.  No Known Allergies  Current Outpatient Prescriptions on File Prior to Visit  Medication Sig Dispense Refill  . cetirizine (ZYRTEC) 10 MG tablet Take 10 mg by mouth daily as needed.       Marland Kitchen FLUoxetine (PROZAC) 20 MG capsule TAKE 1 CAPSULE BY MOUTH DAILY.  90 capsule  1  . pentosan polysulfate (ELMIRON) 100 MG capsule Take 100 mg by mouth as directed.        . SUMAtriptan-naproxen  (TREXIMET) 85-500 MG per tablet Take 1 tablet by mouth daily as needed.        . [DISCONTINUED] topiramate (TOPAMAX) 100 MG tablet TAKE 1 TABLET BY MOUTH TWICE A DAY  180 tablet  3    BP 100/64  Temp 98.5 F (36.9 C) (Oral)  Wt 125 lb 8 oz (56.926 kg)chart    Objective:   Physical Exam  Constitutional: She is oriented to person, place, and time. She appears well-developed and well-nourished.  HENT:  Head: Normocephalic and atraumatic.  Right Ear: External ear normal.  Left Ear: External ear normal.  Nose: Nose normal.  Mouth/Throat: Oropharynx is clear and moist.  Eyes: Conjunctivae normal are normal. Pupils are equal, round, and reactive to light.  Neck: Normal range of motion. No thyromegaly present.  Cardiovascular: Normal rate, regular rhythm and normal heart sounds.   Pulmonary/Chest: Effort normal and breath sounds normal.  Musculoskeletal: Normal range of motion. She exhibits no edema and no tenderness.  Neurological: She is alert and oriented to person, place, and time. She has normal reflexes. She displays normal reflexes. No cranial nerve deficit. Coordination normal.  Skin: Skin is warm and dry.  Psychiatric: She has a normal mood and affect.          Assessment & Plan:  Assessment: Migraine headaches  Plan: Topamax refill. Recheck in one year and sooner when necessary.

## 2012-08-27 ENCOUNTER — Other Ambulatory Visit: Payer: Self-pay | Admitting: Family Medicine

## 2012-08-31 ENCOUNTER — Other Ambulatory Visit: Payer: Self-pay | Admitting: Family Medicine

## 2012-09-08 IMAGING — US US ABDOMEN COMPLETE
1 series · 13 of 25 positions shown · non-contrast
Comparison: None.

CLINICAL DATA: Epigastric and left flank pain with nausea.
History left renal hematoma age 16.

COMPLETE ABDOMINAL ULTRASOUND

[Series 1: us abdomen complete · 0.26mm/px · 13 of 86 slices shown]
[im 1/86]
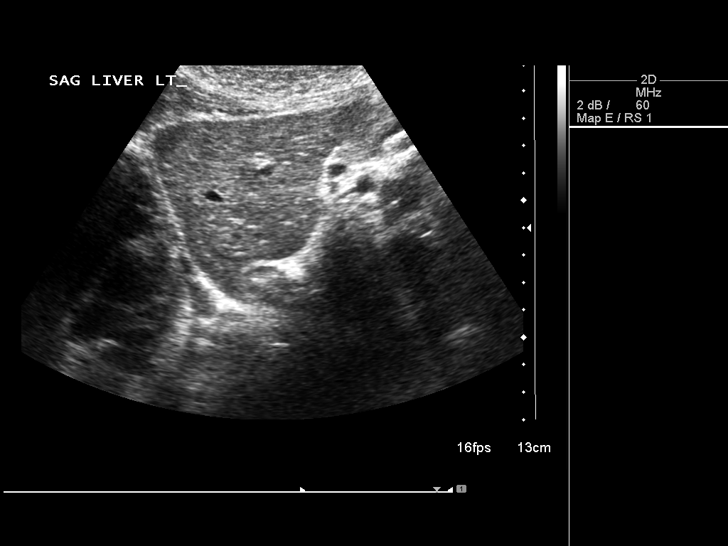
[im 8/86]
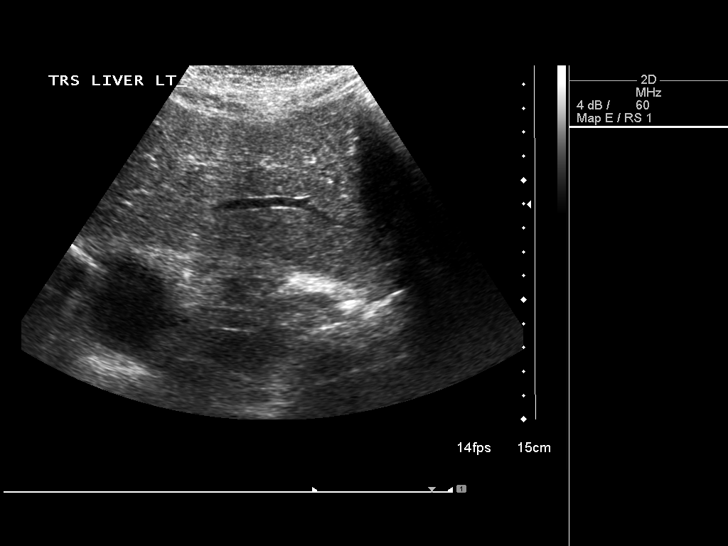
[im 15/86]
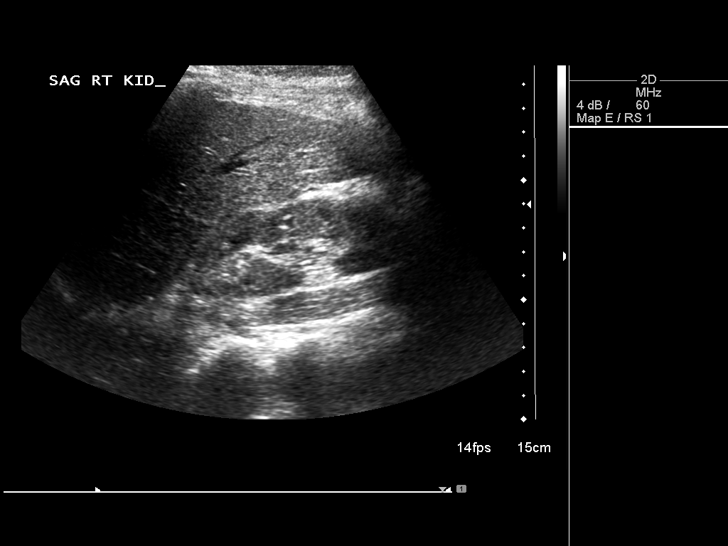
[im 22/86]
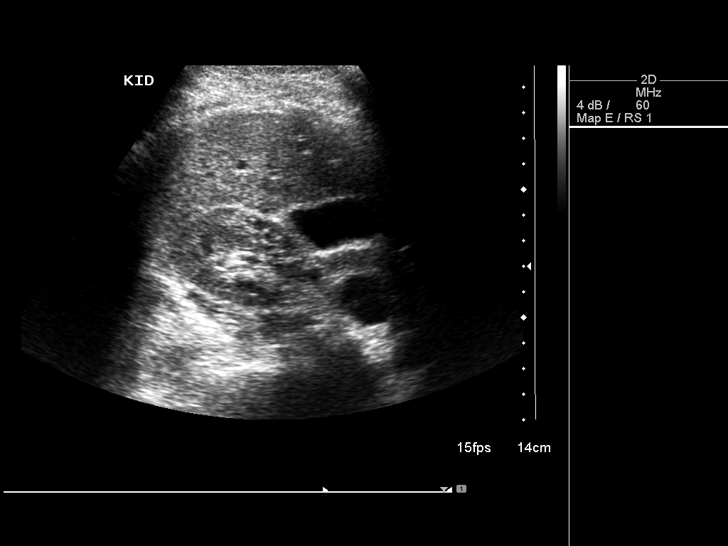
[im 29/86]
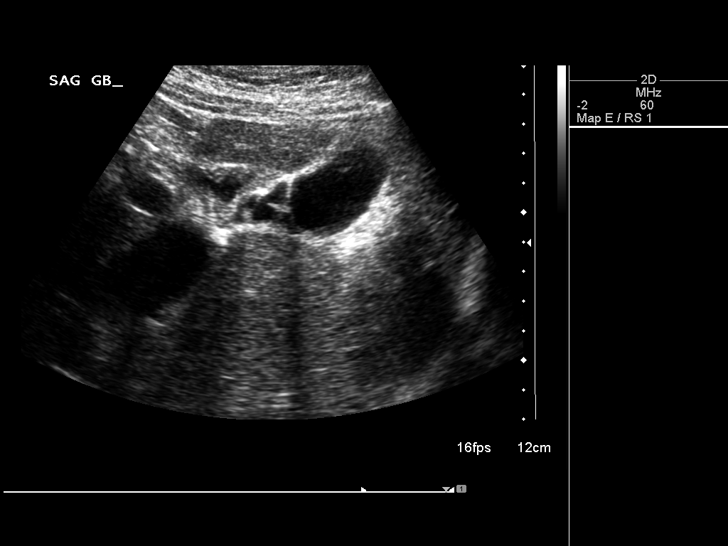
[im 36/86]
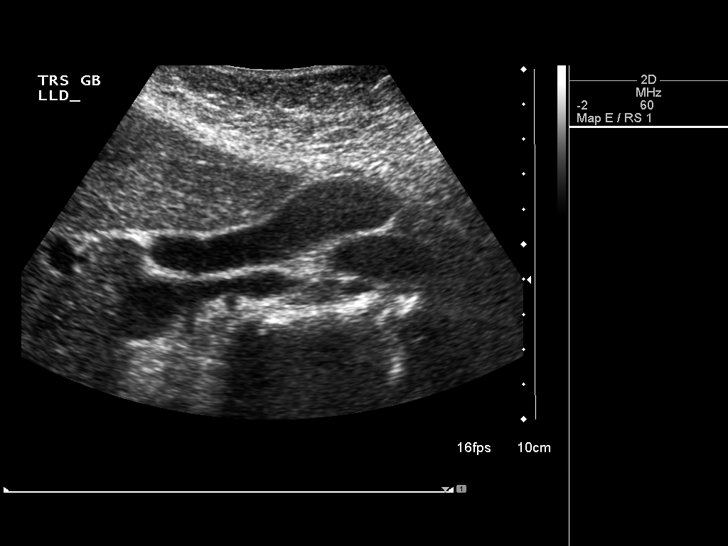
[im 43/86]
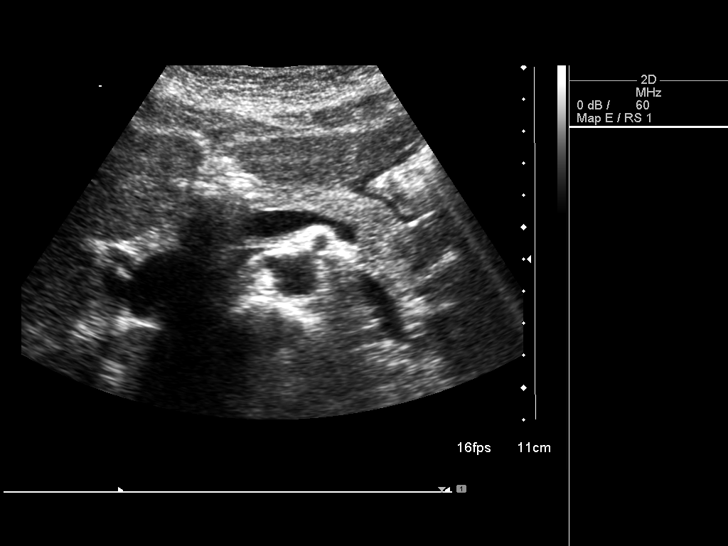
[im 50/86]
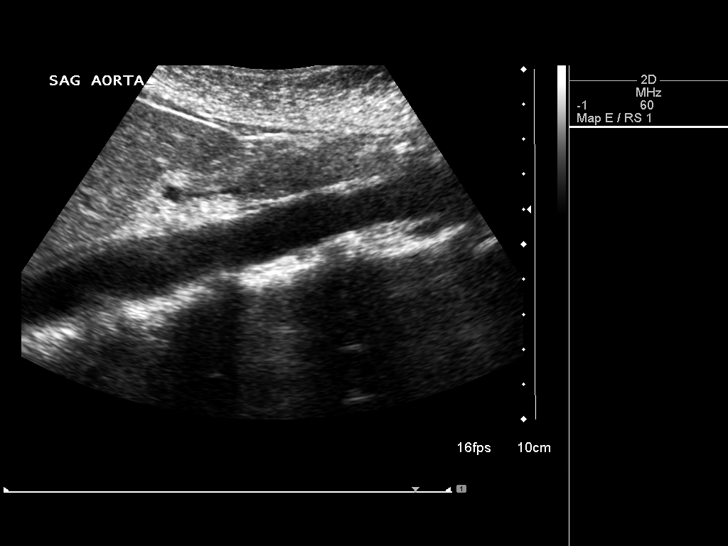
[im 57/86]
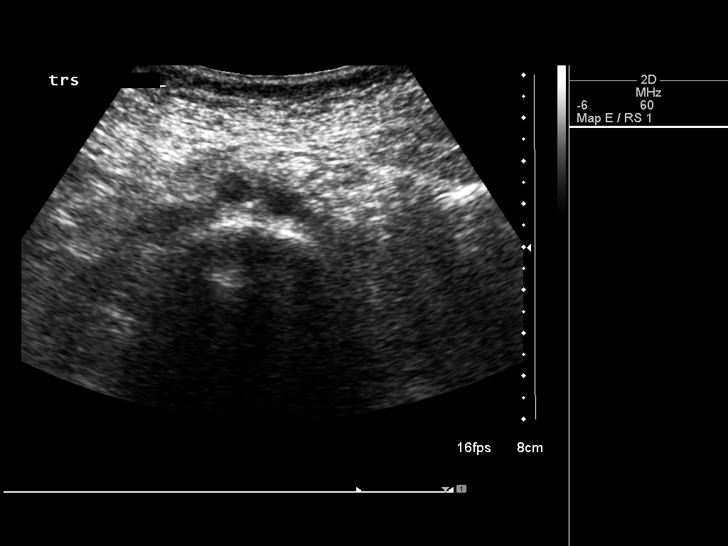
[im 64/86]
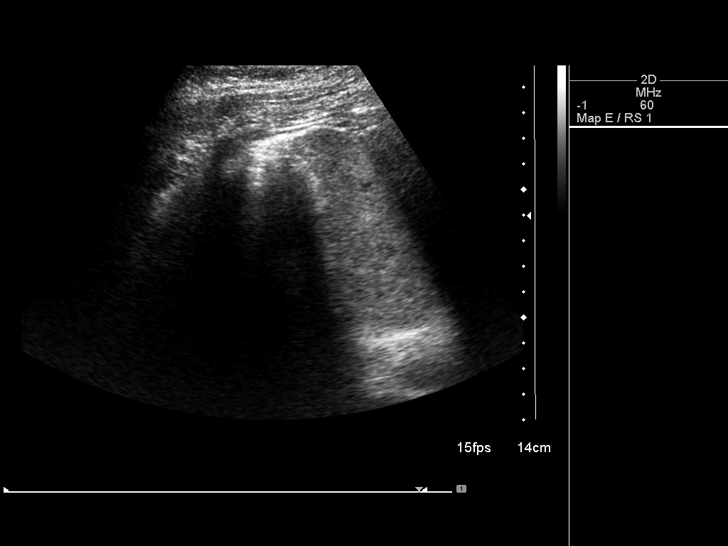
[im 71/86]
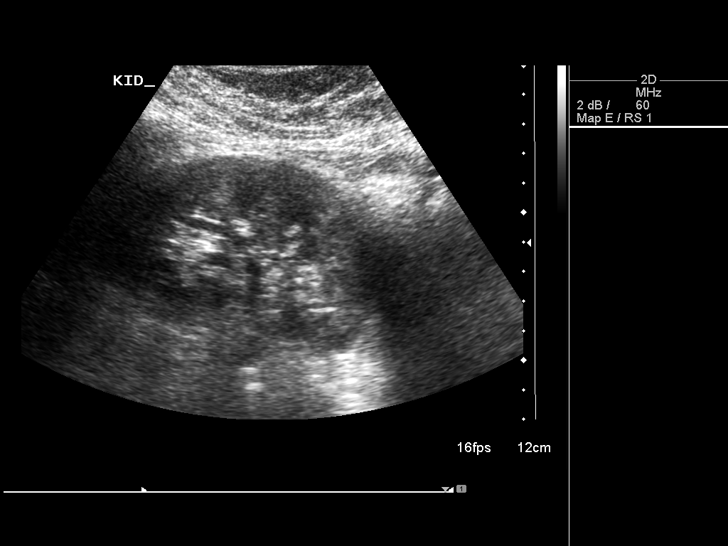
[im 78/86]
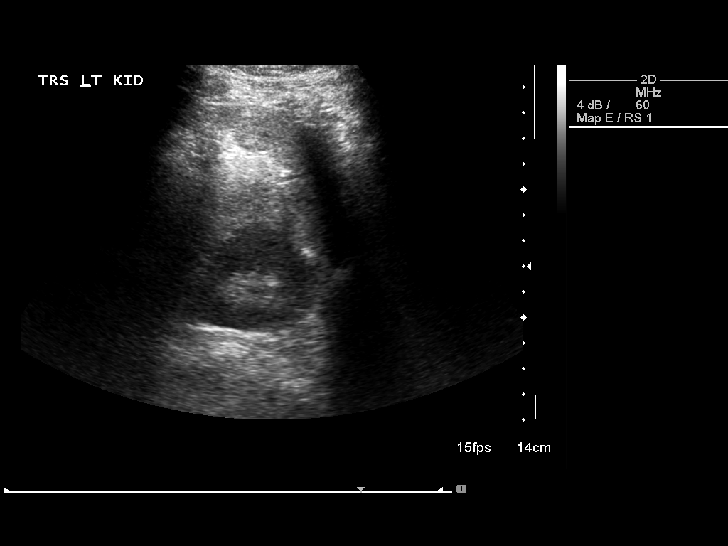
[im 86/86]
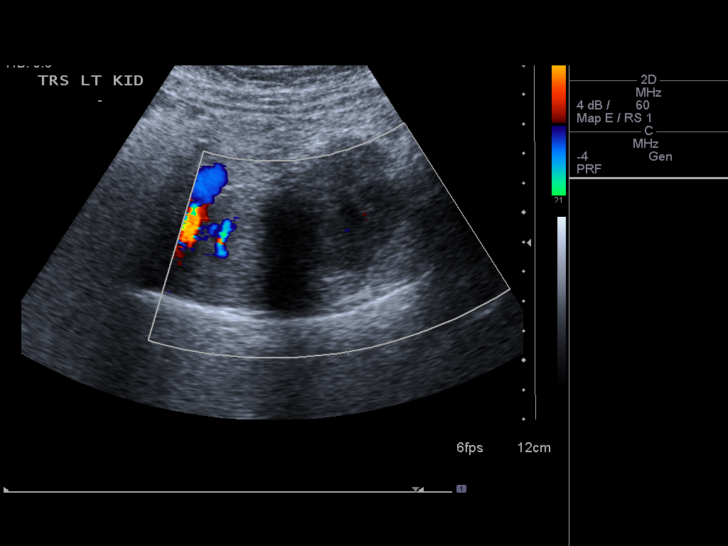

[13 of 25 positions shown; findings below may reference images not displayed]

FINDINGS: Gallbladder:  Sonographically normal without sludge, gallstones
with normal 2 mm wall thickness and no sonographic Murphy's sign
evoked.

Common bile duct:  No dilated intrahepatic or extrahepatic bile
ducts seen with common bile duct measuring normally at 3 mm.

Liver:  Sonographically normal.

IVC:  Appears normal.

Pancreas:  Sonographically normal.

Spleen:  Sonographically normal measuring 6.2 cm long.

Right Kidney:  Sonographically normal measuring 11.2 cm long.

Left Kidney:  Normal in size measuring 10.5 cm long with no
hydronephrosis.  At upper pole is diffuse echogenic exophytic
lesion measuring 4.5 cm long X 3.9 cm AP X 5.2 cm wide.

Abdominal aorta:  No aneurysm identified with maximum proximal
diameter 1.7 cm.
IMPRESSION: 1.  Exophytic echogenic 4.5 X 3.9 X 5.2 cm left suprarenal complex
cyst or solid mass.  In light of previous remote left renal
hematoma history, chronic post traumaticsequela left supra renal
perinephric space possible.  Recommend abdominal CT pre and post
intravenous contrast or abdominal MRI pre and post intravenous
contrast for further evaluation.
2.  Otherwise, normal.

## 2013-02-05 ENCOUNTER — Encounter: Payer: Self-pay | Admitting: Family

## 2013-02-05 ENCOUNTER — Ambulatory Visit (INDEPENDENT_AMBULATORY_CARE_PROVIDER_SITE_OTHER): Payer: 59 | Admitting: Family

## 2013-02-05 VITALS — BP 96/50 | HR 78 | Wt 136.0 lb

## 2013-02-05 DIAGNOSIS — L255 Unspecified contact dermatitis due to plants, except food: Secondary | ICD-10-CM

## 2013-02-05 DIAGNOSIS — L237 Allergic contact dermatitis due to plants, except food: Secondary | ICD-10-CM

## 2013-02-05 DIAGNOSIS — G43909 Migraine, unspecified, not intractable, without status migrainosus: Secondary | ICD-10-CM

## 2013-02-05 MED ORDER — SUMATRIPTAN-NAPROXEN SODIUM 85-500 MG PO TABS: 1.0000 | ORAL_TABLET | Freq: Every day | ORAL | Status: DC | PRN

## 2013-02-05 MED ORDER — METHYLPREDNISOLONE ACETATE 40 MG/ML IJ SUSP
80.0000 mg | Freq: Once | INTRAMUSCULAR | Status: AC
  Administered 2013-02-05: 80 mg via INTRAMUSCULAR

## 2013-02-05 MED ORDER — METHYLPREDNISOLONE ACETATE 80 MG/ML IJ SUSP: 80.0000 mg | Freq: Once | INTRAMUSCULAR | Status: DC

## 2013-02-05 MED ORDER — METHYLPREDNISOLONE 4 MG PO KIT: PACK | ORAL | Status: AC

## 2013-02-05 NOTE — Patient Instructions (Signed)
Poison Ivy Poison ivy is a inflammation of the skin (contact dermatitis) caused by touching the allergens on the leaves of the ivy plant following previous exposure to the plant. The rash usually appears 48 hours after exposure. The rash is usually bumps (papules) or blisters (vesicles) in a linear pattern. Depending on your own sensitivity, the rash may simply cause redness and itching, or it may also progress to blisters which may break open. These must be well cared for to prevent secondary bacterial (germ) infection, followed by scarring. Keep any open areas dry, clean, dressed, and covered with an antibacterial ointment if needed. The eyes may also get puffy. The puffiness is worst in the morning and gets better as the day progresses. This dermatitis usually heals without scarring, within 2 to 3 weeks without treatment. HOME CARE INSTRUCTIONS  Thoroughly wash with soap and water as soon as you have been exposed to poison ivy. You have about one half hour to remove the plant resin before it will cause the rash. This washing will destroy the oil or antigen on the skin that is causing, or will cause, the rash. Be sure to wash under your fingernails as any plant resin there will continue to spread the rash. Do not rub skin vigorously when washing affected area. Poison ivy cannot spread if no oil from the plant remains on your body. A rash that has progressed to weeping sores will not spread the rash unless you have not washed thoroughly. It is also important to wash any clothes you have been wearing as these may carry active allergens. The rash will return if you wear the unwashed clothing, even several days later. Avoidance of the plant in the future is the best measure. Poison ivy plant can be recognized by the number of leaves. Generally, poison ivy has three leaves with flowering branches on a single stem. Diphenhydramine may be purchased over the counter and used as needed for itching. Do not drive with  this medication if it makes you drowsy.Ask your caregiver about medication for children. SEEK MEDICAL CARE IF:  Open sores develop.  Redness spreads beyond area of rash.  You notice purulent (pus-like) discharge.  You have increased pain.  Other signs of infection develop (such as fever). Document Released: 05/31/2000 Document Revised: 08/26/2011 Document Reviewed: 04/19/2009 [employer] Patient Information 2014 [employer].  

## 2013-02-05 NOTE — Addendum Note (Signed)
Addended by: Beverely Low on: 02/05/2013 02:01 PM   Modules accepted: Orders

## 2013-02-05 NOTE — Progress Notes (Signed)
  Subjective:    Patient ID: Barbara Maldonado, female    DOB: 01/07/1966, 47 y.o.   MRN: [id number]  HPI  47 year old white female, nonsmoker, patient of Dr. Cato Mulligan is sent today with poison ivy x6 days. Itching is worsening. She's been applying over-the-counter calamine lotion to help.  Patient has a history of migraine headaches. Takes Topamax daily. Requesting a refill of Treximet.  Review of Systems  Constitutional: Negative.   Respiratory: Negative.   Cardiovascular: Negative.   Skin: Positive for rash.  Allergic/Immunologic: Positive for environmental allergies. Negative for food allergies and immunocompromised state.  Neurological: Negative.   Hematological: Negative.   Psychiatric/Behavioral: Negative.    Past Medical History  Diagnosis Date  . Migraine   . Anxiety   . Depression     History   Social History  . Marital Status: Married    Spouse Name: N/A    Number of Children: N/A  . Years of Education: N/A   Occupational History  . Not on file.   Social History Main Topics  . Smoking status: Never Smoker   . Smokeless tobacco: Not on file  . Alcohol Use: Yes  . Drug Use:   . Sexual Activity:    Other Topics Concern  . Not on file   Social History Narrative  . No narrative on file    Past Surgical History  Procedure Laterality Date  . Bladder repair    . Tubal ligation      No family history on file.  No Known Allergies  Current Outpatient Prescriptions on File Prior to Visit  Medication Sig Dispense Refill  . cetirizine (ZYRTEC) 10 MG tablet Take 10 mg by mouth daily as needed.       Marland Kitchen FLUoxetine (PROZAC) 20 MG capsule TAKE 1 CAPSULE BY MOUTH DAILY.  90 capsule  1  . topiramate (TOPAMAX) 100 MG tablet Take 1 tablet (100 mg total) by mouth 2 (two) times daily.  180 tablet  3  . pentosan polysulfate (ELMIRON) 100 MG capsule Take 100 mg by mouth as directed.         No current facility-administered medications on file prior to visit.    BP  96/50  Pulse 78  Wt 136 lb (61.689 kg)  BMI 24.28 kg/m2chart    Objective:   Physical Exam  Constitutional: She is oriented to person, place, and time. She appears well-developed and well-nourished.  Cardiovascular: Normal rate, regular rhythm and normal heart sounds.   Pulmonary/Chest: Effort normal and breath sounds normal.  Neurological: She is alert and oriented to person, place, and time.  Skin: Skin is warm and dry. Rash noted. There is erythema.  Linear, vesicular rash noted to the lower extremities. Clear drainage.   Psychiatric: She has a normal mood and affect.          Assessment & Plan:  Assessment:  1.Poison Ivy 2. Migraine Headache  Plan: Medrol dosepak as directed. Treximet refilled.

## 2013-02-11 ENCOUNTER — Telehealth: Payer: Self-pay | Admitting: Internal Medicine

## 2013-02-11 NOTE — Telephone Encounter (Signed)
Pharmacy called and stated that they are unable to get a supply of the SUMAtriptan-naproxen (TREXIMET) 85-500 MG per tablet, and would like to know what you'd like them to fill in its place. Please assist.

## 2013-02-12 MED ORDER — NAPROXEN 500 MG PO TABS: 500.0000 mg | ORAL_TABLET | Freq: Two times a day (BID) | ORAL | Status: DC

## 2013-02-12 MED ORDER — SUMATRIPTAN SUCCINATE 100 MG PO TABS: 100.0000 mg | ORAL_TABLET | ORAL | Status: DC | PRN

## 2013-02-12 NOTE — Telephone Encounter (Signed)
Pt aware.

## 2013-02-12 NOTE — Telephone Encounter (Signed)
Left message for pt to call back  °

## 2013-02-12 NOTE — Telephone Encounter (Signed)
LMTCB

## 2013-02-12 NOTE — Telephone Encounter (Signed)
Med to be taken together. Advise patient, sent to pharmacy

## 2013-03-14 ENCOUNTER — Other Ambulatory Visit: Payer: Self-pay | Admitting: Family Medicine

## 2013-05-03 ENCOUNTER — Other Ambulatory Visit: Payer: Self-pay | Admitting: Obstetrics and Gynecology

## 2013-05-11 ENCOUNTER — Other Ambulatory Visit: Payer: Self-pay

## 2013-05-11 MED ORDER — TOPIRAMATE 100 MG PO TABS: 100.0000 mg | ORAL_TABLET | Freq: Two times a day (BID) | ORAL | Status: DC

## 2013-05-18 ENCOUNTER — Telehealth: Payer: Self-pay | Admitting: Family

## 2013-05-18 NOTE — Telephone Encounter (Signed)
Will fill when pt is seen. Pt aware

## 2013-05-18 NOTE — Telephone Encounter (Signed)
Pt has made fup for tomorrow.@ 10:45 am.  Would like to know if you would call in a 90 day RX topiramate (TOPAMAX) 100 MG tablet.  Pt did not pu the 30 day called in b/c of the cost. Much less expensive for 90 day/ pt takes 2/day and has none left. [facility] [place]

## 2013-05-19 ENCOUNTER — Ambulatory Visit (INDEPENDENT_AMBULATORY_CARE_PROVIDER_SITE_OTHER): Payer: 59 | Admitting: Family

## 2013-05-19 ENCOUNTER — Encounter: Payer: Self-pay | Admitting: Family

## 2013-05-19 ENCOUNTER — Other Ambulatory Visit: Payer: Self-pay | Admitting: Obstetrics and Gynecology

## 2013-05-19 VITALS — BP 110/60 | HR 57 | Wt 130.0 lb

## 2013-05-19 DIAGNOSIS — G43909 Migraine, unspecified, not intractable, without status migrainosus: Secondary | ICD-10-CM

## 2013-05-19 DIAGNOSIS — F411 Generalized anxiety disorder: Secondary | ICD-10-CM

## 2013-05-19 MED ORDER — TOPIRAMATE 100 MG PO TABS: 100.0000 mg | ORAL_TABLET | Freq: Two times a day (BID) | ORAL | Status: DC

## 2013-05-19 NOTE — Progress Notes (Signed)
Pre visit review using our clinic review tool, if applicable. No additional management support is needed unless otherwise documented below in the visit note. 

## 2013-05-19 NOTE — Progress Notes (Signed)
Subjective:    Patient ID: Barbara Maldonado, female    DOB: December 27, 1965, 47 y.o.   MRN: [id number]  HPI 47 year old white female, nonsmoker, patient of Dr. Cato Mulligan is sent today for followup of migraine headaches. She is currently stable taken Topamax daily and using a combination of Imitrex and naproxen for acute migraine headaches. She typically has a headache about 2-3 times per month that breaks with Imitrex and naproxen.  Taking  Prozac for anxiety that works well.  Review of Systems  Constitutional: Negative.   HENT: Negative.   Eyes: Negative.   Respiratory: Negative.   Musculoskeletal: Negative.   Skin: Negative.   Neurological: Negative.   Hematological: Negative.   Psychiatric/Behavioral: Negative.    Past Medical History  Diagnosis Date  . Migraine   . Anxiety   . Depression     History   Social History  . Marital Status: Married    Spouse Name: N/A    Number of Children: N/A  . Years of Education: N/A   Occupational History  . Not on file.   Social History Main Topics  . Smoking status: Never Smoker   . Smokeless tobacco: Not on file  . Alcohol Use: Yes  . Drug Use:   . Sexual Activity:    Other Topics Concern  . Not on file   Social History Narrative  . No narrative on file    Past Surgical History  Procedure Laterality Date  . Bladder repair    . Tubal ligation      No family history on file.  No Known Allergies  Current Outpatient Prescriptions on File Prior to Visit  Medication Sig Dispense Refill  . cetirizine (ZYRTEC) 10 MG tablet Take 10 mg by mouth daily as needed.       Marland Kitchen FLUoxetine (PROZAC) 20 MG capsule TAKE 1 CAPSULE BY MOUTH DAILY.  90 capsule  1  . naproxen (NAPROSYN) 500 MG tablet Take 1 tablet (500 mg total) by mouth 2 (two) times daily with a meal.  30 tablet  3  . pentosan polysulfate (ELMIRON) 100 MG capsule Take 100 mg by mouth as directed.        . SUMAtriptan (IMITREX) 100 MG tablet Take 1 tablet (100 mg total) by  mouth every 2 (two) hours as needed for migraine. Take 1 naprosyn with therapy  10 tablet  3   No current facility-administered medications on file prior to visit.    BP 110/60  Pulse 57  Wt 130 lb (58.968 kg)  SpO2 99%chart    Objective:   Physical Exam  Constitutional: She is oriented to person, place, and time. She appears well-developed.  HENT:  Right Ear: External ear normal.  Left Ear: External ear normal.  Nose: Nose normal.  Mouth/Throat: Oropharynx is clear and moist.  Eyes: Conjunctivae are normal. Pupils are equal, round, and reactive to light.  Neck: Normal range of motion. Neck supple.  Cardiovascular: Normal rate, regular rhythm and normal heart sounds.   Pulmonary/Chest: Effort normal and breath sounds normal.  Musculoskeletal: Normal range of motion.  Neurological: She is alert and oriented to person, place, and time.  Skin: Skin is warm and dry.  Psychiatric: She has a normal mood and affect.          Assessment & Plan:  Assessment: 1. Migraine headache 2. Anxiety  Plan: Prescription for Topamax. Continue rescue medication as needed. Call the office with any questions or concerns. Recheck in 6 months  and sooner as needed.on and

## 2013-06-02 ENCOUNTER — Encounter (HOSPITAL_COMMUNITY): Payer: Self-pay

## 2013-06-02 ENCOUNTER — Encounter (HOSPITAL_COMMUNITY)
Admission: RE | Admit: 2013-06-02 | Discharge: 2013-06-02 | Disposition: A | Discharge status: Home / Self Care | Payer: 59 | Source: Ambulatory Visit | Attending: Obstetrics and Gynecology | Admitting: Obstetrics and Gynecology

## 2013-06-02 DIAGNOSIS — Z01812 Encounter for preprocedural laboratory examination: Secondary | ICD-10-CM | POA: Insufficient documentation

## 2013-06-02 HISTORY — DX: Nausea with vomiting, unspecified: R11.2

## 2013-06-02 HISTORY — DX: Other specified postprocedural states: Z98.890

## 2013-06-02 LAB — CBC
Hemoglobin: 12.8 g/dL (ref 12.0–15.0)
MCH: 30.3 pg (ref 26.0–34.0)
Platelets: 255 10*3/uL (ref 150–400)
RBC: 4.23 MIL/uL (ref 3.87–5.11)
RDW: 12.4 % (ref 11.5–15.5)

## 2013-06-02 NOTE — Patient Instructions (Addendum)
Your procedure is scheduled on: 06/08/2013  Enter through the Main Entrance of [facility] at: 0600am  Pick up the phone at the desk and dial [phone].  Call this number if you have problems the morning of surgery: [phone].  Remember: Do NOT eat food: AFTER MIDNIGHT MONDAY Do NOT drink clear liquids after: AFTER MIDNIGHT MONDAY Take these medicines the morning of surgery with a SIP OF WATER: Prozac, Topamax Do NOT wear jewelry (body piercing), make-up, or nail polish. Do NOT wear lotions, powders, or perfumes.  You may wear deoderant. Do NOT shave for 48 hours prior to surgery. Do NOT bring valuables to the hospital. Contacts, dentures, or bridgework may not be worn into surgery. Leave suitcase in car.  After surgery it may be brought to your room.  For patients admitted to the hospital, checkout time is 11:00 AM the day of discharge.

## 2013-06-07 MED ORDER — CEFAZOLIN SODIUM-DEXTROSE 2-3 GM-% IV SOLR: 2.0000 g | INTRAVENOUS | Status: DC

## 2013-06-08 ENCOUNTER — Encounter (HOSPITAL_COMMUNITY): Payer: Self-pay | Admitting: *Deleted

## 2013-06-08 ENCOUNTER — Observation Stay (HOSPITAL_COMMUNITY)
Admission: RE | Admit: 2013-06-08 | Discharge: 2013-06-09 | Disposition: A | Discharge status: Home / Self Care | Payer: 59 | Source: Ambulatory Visit | Attending: Obstetrics and Gynecology | Admitting: Obstetrics and Gynecology

## 2013-06-08 ENCOUNTER — Encounter (HOSPITAL_COMMUNITY)
Admission: RE | Disposition: A | Discharge status: Home / Self Care | Payer: Self-pay | Source: Ambulatory Visit | Attending: Obstetrics and Gynecology

## 2013-06-08 ENCOUNTER — Ambulatory Visit (HOSPITAL_COMMUNITY): Payer: 59 | Admitting: Registered Nurse

## 2013-06-08 ENCOUNTER — Encounter (HOSPITAL_COMMUNITY): Payer: 59 | Admitting: Registered Nurse

## 2013-06-08 DIAGNOSIS — IMO0002 Reserved for concepts with insufficient information to code with codable children: Secondary | ICD-10-CM | POA: Insufficient documentation

## 2013-06-08 DIAGNOSIS — Z8742 Personal history of other diseases of the female genital tract: Secondary | ICD-10-CM | POA: Insufficient documentation

## 2013-06-08 DIAGNOSIS — N949 Unspecified condition associated with female genital organs and menstrual cycle: Principal | ICD-10-CM | POA: Insufficient documentation

## 2013-06-08 DIAGNOSIS — N831 Corpus luteum cyst of ovary, unspecified side: Secondary | ICD-10-CM | POA: Insufficient documentation

## 2013-06-08 DIAGNOSIS — N838 Other noninflammatory disorders of ovary, fallopian tube and broad ligament: Secondary | ICD-10-CM | POA: Insufficient documentation

## 2013-06-08 DIAGNOSIS — R102 Pelvic and perineal pain: Secondary | ICD-10-CM | POA: Diagnosis present

## 2013-06-08 HISTORY — PX: BILATERAL SALPINGECTOMY: SHX5743

## 2013-06-08 HISTORY — PX: LAPAROSCOPIC ASSISTED VAGINAL HYSTERECTOMY: SHX5398

## 2013-06-08 HISTORY — PX: OOPHORECTOMY: SHX6387

## 2013-06-08 LAB — PREGNANCY, URINE: Preg Test, Ur: NEGATIVE

## 2013-06-08 SURGERY — HYSTERECTOMY, VAGINAL, LAPAROSCOPY-ASSISTED
Anesthesia: General | Site: Abdomen

## 2013-06-08 MED ORDER — NEOSTIGMINE METHYLSULFATE 1 MG/ML IJ SOLN
INTRAMUSCULAR | Status: AC
  Filled 2013-06-08: qty 1

## 2013-06-08 MED ORDER — SCOPOLAMINE 1 MG/3DAYS TD PT72
MEDICATED_PATCH | TRANSDERMAL | Status: AC
  Administered 2013-06-08: 1.5 mg
  Filled 2013-06-08: qty 1

## 2013-06-08 MED ORDER — ONDANSETRON HCL 4 MG/2ML IJ SOLN: 4.0000 mg | Freq: Four times a day (QID) | INTRAMUSCULAR | Status: DC | PRN

## 2013-06-08 MED ORDER — LIDOCAINE-EPINEPHRINE 1 %-1:100000 IJ SOLN
INTRAMUSCULAR | Status: AC
  Filled 2013-06-08: qty 2

## 2013-06-08 MED ORDER — KETOROLAC TROMETHAMINE 30 MG/ML IJ SOLN
INTRAMUSCULAR | Status: DC | PRN
  Administered 2013-06-08: 30 mg via INTRAVENOUS

## 2013-06-08 MED ORDER — ZOLPIDEM TARTRATE 5 MG PO TABS: 5.0000 mg | ORAL_TABLET | Freq: Every evening | ORAL | Status: DC | PRN

## 2013-06-08 MED ORDER — DEXAMETHASONE SODIUM PHOSPHATE 10 MG/ML IJ SOLN
INTRAMUSCULAR | Status: AC
  Filled 2013-06-08: qty 1

## 2013-06-08 MED ORDER — CEFAZOLIN SODIUM-DEXTROSE 2-3 GM-% IV SOLR
INTRAVENOUS | Status: AC
  Administered 2013-06-08: 2 g via INTRAVENOUS
  Filled 2013-06-08: qty 50

## 2013-06-08 MED ORDER — ONDANSETRON HCL 4 MG/2ML IJ SOLN
INTRAMUSCULAR | Status: AC
  Filled 2013-06-08: qty 2

## 2013-06-08 MED ORDER — PROPOFOL 10 MG/ML IV BOLUS
INTRAVENOUS | Status: DC | PRN
  Administered 2013-06-08: 170 mg via INTRAVENOUS

## 2013-06-08 MED ORDER — SCOPOLAMINE 1 MG/3DAYS TD PT72: 1.0000 | MEDICATED_PATCH | TRANSDERMAL | Status: DC

## 2013-06-08 MED ORDER — DEXTROSE IN LACTATED RINGERS 5 % IV SOLN
INTRAVENOUS | Status: DC
  Administered 2013-06-08 – 2013-06-09 (×3): via INTRAVENOUS

## 2013-06-08 MED ORDER — DOCUSATE SODIUM 100 MG PO CAPS
100.0000 mg | ORAL_CAPSULE | Freq: Two times a day (BID) | ORAL | Status: DC
  Administered 2013-06-08 – 2013-06-09 (×2): 100 mg via ORAL
  Filled 2013-06-08 (×2): qty 1

## 2013-06-08 MED ORDER — ONDANSETRON HCL 4 MG PO TABS: 4.0000 mg | ORAL_TABLET | Freq: Four times a day (QID) | ORAL | Status: DC | PRN

## 2013-06-08 MED ORDER — SIMETHICONE 80 MG PO CHEW
80.0000 mg | CHEWABLE_TABLET | Freq: Four times a day (QID) | ORAL | Status: DC | PRN
  Administered 2013-06-08 – 2013-06-09 (×2): 80 mg via ORAL
  Filled 2013-06-08 (×2): qty 1

## 2013-06-08 MED ORDER — LIDOCAINE-EPINEPHRINE 1 %-1:100000 IJ SOLN
INTRAMUSCULAR | Status: DC | PRN
  Administered 2013-06-08: 10 mL

## 2013-06-08 MED ORDER — HYDROMORPHONE HCL PF 1 MG/ML IJ SOLN
INTRAMUSCULAR | Status: AC
  Filled 2013-06-08: qty 1

## 2013-06-08 MED ORDER — NEOSTIGMINE METHYLSULFATE 1 MG/ML IJ SOLN
INTRAMUSCULAR | Status: DC | PRN
  Administered 2013-06-08: 3 mg via INTRAVENOUS

## 2013-06-08 MED ORDER — PANTOPRAZOLE SODIUM 40 MG PO TBEC
DELAYED_RELEASE_TABLET | ORAL | Status: AC
  Filled 2013-06-08: qty 1

## 2013-06-08 MED ORDER — BUPIVACAINE HCL (PF) 0.25 % IJ SOLN
INTRAMUSCULAR | Status: DC | PRN
  Administered 2013-06-08: 3 mL

## 2013-06-08 MED ORDER — HYDROMORPHONE HCL PF 1 MG/ML IJ SOLN
0.2000 mg | INTRAMUSCULAR | Status: DC | PRN
  Administered 2013-06-08: 0.6 mg via INTRAVENOUS
  Filled 2013-06-08: qty 1

## 2013-06-08 MED ORDER — LIDOCAINE HCL (CARDIAC) 20 MG/ML IV SOLN
INTRAVENOUS | Status: AC
  Filled 2013-06-08: qty 5

## 2013-06-08 MED ORDER — LACTATED RINGERS IR SOLN
Status: DC | PRN
  Administered 2013-06-08: 3000 mL

## 2013-06-08 MED ORDER — ROCURONIUM BROMIDE 100 MG/10ML IV SOLN
INTRAVENOUS | Status: AC
  Filled 2013-06-08: qty 1

## 2013-06-08 MED ORDER — LACTATED RINGERS IV SOLN
INTRAVENOUS | Status: DC
  Administered 2013-06-08: 09:00:00 via INTRAVENOUS
  Administered 2013-06-08: 1000 mL via INTRAVENOUS

## 2013-06-08 MED ORDER — ROCURONIUM BROMIDE 100 MG/10ML IV SOLN
INTRAVENOUS | Status: DC | PRN
  Administered 2013-06-08: 60 mg via INTRAVENOUS

## 2013-06-08 MED ORDER — OXYCODONE-ACETAMINOPHEN 5-325 MG PO TABS
1.0000 | ORAL_TABLET | ORAL | Status: DC | PRN
  Administered 2013-06-08: 1 via ORAL
  Administered 2013-06-09 (×2): 2 via ORAL
  Filled 2013-06-08: qty 2
  Filled 2013-06-08: qty 1
  Filled 2013-06-08: qty 2

## 2013-06-08 MED ORDER — ONDANSETRON HCL 4 MG/2ML IJ SOLN
INTRAMUSCULAR | Status: DC | PRN
  Administered 2013-06-08: 4 mg via INTRAVENOUS

## 2013-06-08 MED ORDER — HYDROMORPHONE HCL PF 1 MG/ML IJ SOLN
INTRAMUSCULAR | Status: DC | PRN
  Administered 2013-06-08: 1 mg via INTRAVENOUS

## 2013-06-08 MED ORDER — PROPOFOL 10 MG/ML IV EMUL
INTRAVENOUS | Status: AC
  Filled 2013-06-08: qty 20

## 2013-06-08 MED ORDER — GLYCOPYRROLATE 0.2 MG/ML IJ SOLN
INTRAMUSCULAR | Status: DC | PRN
  Administered 2013-06-08: 0.4 mg via INTRAVENOUS

## 2013-06-08 MED ORDER — BUPIVACAINE HCL (PF) 0.25 % IJ SOLN
INTRAMUSCULAR | Status: AC
  Filled 2013-06-08: qty 30

## 2013-06-08 MED ORDER — MIDAZOLAM HCL 2 MG/2ML IJ SOLN
INTRAMUSCULAR | Status: AC
  Filled 2013-06-08: qty 2

## 2013-06-08 MED ORDER — FENTANYL CITRATE 0.05 MG/ML IJ SOLN
INTRAMUSCULAR | Status: DC | PRN
  Administered 2013-06-08 (×2): 50 ug via INTRAVENOUS
  Administered 2013-06-08: 150 ug via INTRAVENOUS

## 2013-06-08 MED ORDER — TOPIRAMATE 100 MG PO TABS
100.0000 mg | ORAL_TABLET | Freq: Two times a day (BID) | ORAL | Status: DC
  Administered 2013-06-08 – 2013-06-09 (×2): 100 mg via ORAL
  Filled 2013-06-08 (×3): qty 1

## 2013-06-08 MED ORDER — KETOROLAC TROMETHAMINE 30 MG/ML IJ SOLN
INTRAMUSCULAR | Status: AC
  Filled 2013-06-08: qty 1

## 2013-06-08 MED ORDER — DEXAMETHASONE SODIUM PHOSPHATE 10 MG/ML IJ SOLN
INTRAMUSCULAR | Status: DC | PRN
  Administered 2013-06-08: 10 mg via INTRAVENOUS

## 2013-06-08 MED ORDER — GLYCOPYRROLATE 0.2 MG/ML IJ SOLN
INTRAMUSCULAR | Status: AC
  Filled 2013-06-08: qty 2

## 2013-06-08 MED ORDER — FENTANYL CITRATE 0.05 MG/ML IJ SOLN
INTRAMUSCULAR | Status: AC
  Filled 2013-06-08: qty 5

## 2013-06-08 MED ORDER — LIDOCAINE HCL (CARDIAC) 20 MG/ML IV SOLN
INTRAVENOUS | Status: DC | PRN
  Administered 2013-06-08: 50 mg via INTRAVENOUS

## 2013-06-08 MED ORDER — HYDROMORPHONE HCL PF 1 MG/ML IJ SOLN: 0.2500 mg | INTRAMUSCULAR | Status: DC | PRN

## 2013-06-08 MED ORDER — FLUOXETINE HCL 20 MG PO CAPS
20.0000 mg | ORAL_CAPSULE | Freq: Every day | ORAL | Status: DC
  Administered 2013-06-09: 20 mg via ORAL
  Filled 2013-06-08: qty 1

## 2013-06-08 MED ORDER — MIDAZOLAM HCL 5 MG/5ML IJ SOLN
INTRAMUSCULAR | Status: DC | PRN
  Administered 2013-06-08: 2 mg via INTRAVENOUS

## 2013-06-08 SURGICAL SUPPLY — 34 items
ADH SKN CLS APL DERMABOND .7 (GAUZE/BANDAGES/DRESSINGS) ×4
CATH ROBINSON RED A/P 16FR (CATHETERS) IMPLANT
CLOTH BEACON ORANGE TIMEOUT ST (SAFETY) ×5 IMPLANT
COVER TABLE BACK 60X90 (DRAPES) ×5 IMPLANT
DECANTER SPIKE VIAL GLASS SM (MISCELLANEOUS) IMPLANT
DERMABOND ADVANCED (GAUZE/BANDAGES/DRESSINGS) ×1
DERMABOND ADVANCED .7 DNX12 (GAUZE/BANDAGES/DRESSINGS) ×4 IMPLANT
ELECT REM PT RETURN 9FT ADLT (ELECTROSURGICAL)
ELECTRODE REM PT RTRN 9FT ADLT (ELECTROSURGICAL) IMPLANT
EVACUATOR SMOKE 8.L (FILTER) IMPLANT
FORCEPS CUTTING 33CM 5MM (CUTTING FORCEPS) IMPLANT
GLOVE ECLIPSE 7.0 STRL STRAW (GLOVE) ×10 IMPLANT
GOWN STRL REIN XL XLG (GOWN DISPOSABLE) ×20 IMPLANT
NS IRRIG 1000ML POUR BTL (IV SOLUTION) ×5 IMPLANT
PACK LAVH (CUSTOM PROCEDURE TRAY) ×5 IMPLANT
PROTECTOR NERVE ULNAR (MISCELLANEOUS) ×5 IMPLANT
SCISSORS LAP 5X35 DISP (ENDOMECHANICALS) IMPLANT
SET IRRIG TUBING LAPAROSCOPIC (IRRIGATION / IRRIGATOR) IMPLANT
SOLUTION ELECTROLUBE (MISCELLANEOUS) IMPLANT
STRIP CLOSURE SKIN 1/4X3 (GAUZE/BANDAGES/DRESSINGS) IMPLANT
SUT MNCRL 0 MO-4 VIOLET 18 CR (SUTURE) ×8 IMPLANT
SUT MNCRL 0 VIOLET 6X18 (SUTURE) ×4 IMPLANT
SUT MONOCRYL 0 6X18 (SUTURE) ×1
SUT MONOCRYL 0 MO 4 18  CR/8 (SUTURE) ×2
SUT VIC AB 2-0 CT1 27 (SUTURE) ×15
SUT VIC AB 2-0 CT1 TAPERPNT 27 (SUTURE) ×12 IMPLANT
SUT VICRYL 0 UR6 27IN ABS (SUTURE) ×10 IMPLANT
SUT VICRYL RAPIDE 3 0 (SUTURE) ×5 IMPLANT
TOWEL OR 17X24 6PK STRL BLUE (TOWEL DISPOSABLE) ×10 IMPLANT
TRAY FOLEY BAG SILVER LF 14FR (CATHETERS) ×5 IMPLANT
TROCAR BALLN 12MMX100 BLUNT (TROCAR) ×5 IMPLANT
TROCAR XCEL NON-BLD 5MMX100MML (ENDOMECHANICALS) ×10 IMPLANT
WARMER LAPAROSCOPE (MISCELLANEOUS) ×5 IMPLANT
WATER STERILE IRR 1000ML POUR (IV SOLUTION) ×5 IMPLANT

## 2013-06-08 NOTE — Anesthesia Postprocedure Evaluation (Signed)
  Anesthesia Post-op Note  Patient: Barbara Maldonado  Procedure(s) Performed: Procedure(s): LAPAROSCOPIC ASSISTED VAGINAL HYSTERECTOMY (N/A) BILATERAL SALPINGECTOMY (Bilateral) OOPHORECTOMY (Left) Patient is awake and responsive. Pain and nausea are reasonably well controlled. Vital signs are stable and clinically acceptable. Oxygen saturation is clinically acceptable. There are no apparent anesthetic complications at this time. Patient is ready for discharge.

## 2013-06-08 NOTE — Transfer of Care (Signed)
Immediate Anesthesia Transfer of Care Note  Patient: Barbara Maldonado  Procedure(s) Performed: Procedure(s): LAPAROSCOPIC ASSISTED VAGINAL HYSTERECTOMY (N/A) BILATERAL SALPINGECTOMY (Bilateral) OOPHORECTOMY (Left)  Patient Location: PACU  Anesthesia Type:General  Level of Consciousness: sedated  Airway & Oxygen Therapy: Patient Spontanous Breathing and Patient connected to nasal cannula oxygen  Post-op Assessment: Report given to PACU RN  Post vital signs: Reviewed  Complications: No apparent anesthesia complications

## 2013-06-08 NOTE — Anesthesia Postprocedure Evaluation (Signed)
  Anesthesia Post-op Note  Patient: Barbara Maldonado  Procedure(s) Performed: Procedure(s): LAPAROSCOPIC ASSISTED VAGINAL HYSTERECTOMY (N/A) BILATERAL SALPINGECTOMY (Bilateral) OOPHORECTOMY (Left)  Patient Location: [facility]  Anesthesia Type:General  Level of Consciousness: awake, alert  and oriented  Airway and Oxygen Therapy: Patient Spontanous Breathing  Post-op Pain: none  Post-op Assessment: Post-op Vital signs reviewed and Patient's Cardiovascular Status Stable  Post-op Vital Signs: Reviewed and stable  Complications: No apparent anesthesia complications

## 2013-06-08 NOTE — Anesthesia Preprocedure Evaluation (Signed)

## 2013-06-08 NOTE — Preoperative (Signed)
Beta Blockers   Reason not to administer Beta Blockers:Not Applicable 

## 2013-06-08 NOTE — H&P (Signed)
Barbara Maldonado is an 47 y.o. who presents to the OR for a LAVHLSO, right salpingectomy for a long h.o. Pelvic pain. The pt states pain has been there for over 5 yrs. She did receive a course of Lupron which did help the pain, but endometriosis has never been confirmed.  Chief Complaint: HPI:  Past Medical History  Diagnosis Date  . Migraine   . Anxiety   . Depression   . PONV (postoperative nausea and vomiting)     Past Surgical History  Procedure Laterality Date  . Bladder repair    . Tubal ligation    . Ablation    . Laparoscopic ovarian cystectomy      History reviewed. No pertinent family history. Social History:  reports that she has never smoked. She does not have any smokeless tobacco history on file. She reports that she does not drink alcohol or use illicit drugs.  Allergies: No Known Allergies  Medications Prior to Admission  Medication Sig Dispense Refill  . cetirizine (ZYRTEC) 10 MG tablet Take 10 mg by mouth daily as needed.       Marland Kitchen FLUoxetine (PROZAC) 20 MG capsule TAKE 1 CAPSULE BY MOUTH DAILY.  90 capsule  1  . naproxen (NAPROSYN) 500 MG tablet Take 1 tablet (500 mg total) by mouth 2 (two) times daily with a meal.  30 tablet  3  . SUMAtriptan (IMITREX) 100 MG tablet Take 1 tablet (100 mg total) by mouth every 2 (two) hours as needed for migraine. Take 1 naprosyn with therapy  10 tablet  3  . topiramate (TOPAMAX) 100 MG tablet Take 1 tablet (100 mg total) by mouth 2 (two) times daily.  180 tablet  1     Pertinent items are noted in HPI.  Blood pressure 110/63, pulse 59, temperature 98.1 F (36.7 C), temperature source Oral, resp. rate 16. BP 110/63  Pulse 59  Temp(Src) 98.1 F (36.7 C) (Oral)  Resp 16 General appearance: alert and cooperative Lungs: clear to auscultation bilaterally Heart: regular rate and rhythm, S1, S2 normal, no murmur, click, rub or gallop Abdomen: soft, non-tender; bowel sounds normal; no masses,  no organomegaly   Lab Results   Component Value Date   WBC 6.4 06/02/2013   HGB 12.8 06/02/2013   HCT 37.9 06/02/2013   MCV 89.6 06/02/2013   PLT 255 06/02/2013   Lab Results  Component Value Date   PREGTESTUR NEGATIVE 06/08/2013     Assessment/Plan Proceed with Ablation  Patient Active Problem List   Diagnosis Date Noted  . MIGRAINE HEADACHE 04/20/2010   Chronic Pelvic pain, possible endometriosis. Will procced with LAVHLSO, right salpingectomy.  Levi Aland 06/08/2013, 6:48 AM

## 2013-06-09 ENCOUNTER — Encounter (HOSPITAL_COMMUNITY): Payer: Self-pay | Admitting: Obstetrics and Gynecology

## 2013-06-09 LAB — CBC
HCT: 28.5 % — ABNORMAL LOW (ref 36.0–46.0)
Hemoglobin: 10.1 g/dL — ABNORMAL LOW (ref 12.0–15.0)
MCHC: 35.4 g/dL (ref 30.0–36.0)
Platelets: 210 10*3/uL (ref 150–400)
RBC: 3.17 MIL/uL — ABNORMAL LOW (ref 3.87–5.11)

## 2013-06-09 MED ORDER — OXYCODONE-ACETAMINOPHEN 5-325 MG PO TABS: 1.0000 | ORAL_TABLET | ORAL | Status: DC | PRN

## 2013-06-09 NOTE — Progress Notes (Signed)
POD#1 Pt doing well . Would like to go home. Will ambulate and assess. VSSAF HGB-stable IMP/Doing well Plan/ Will discharge to home.

## 2013-06-09 NOTE — Progress Notes (Signed)
Ur chart review completed.  

## 2013-06-09 NOTE — Progress Notes (Signed)
Pt discharged to home with significant other.  Condition stable.  Pt ambulated to car with C. Riley, NT.  No equipment for home ordered at discharge. 

## 2013-06-18 NOTE — Discharge Summary (Signed)
Barbara Maldonado, Barbara Maldonado                ACCOUNT NO.:  [id number]  MEDICAL RECORD NO.:  [id number]  LOCATION:  2355                          FACILITY:  [facility]  PHYSICIAN:  Freda Munro, M.D.    DATE OF BIRTH:  [birth year]  DATE OF ADMISSION:  06/08/2013 DATE OF DISCHARGE:  06/09/2013                              DISCHARGE SUMMARY   PRINCIPAL DISCHARGE DIAGNOSES: 1. Chronic pelvic pain. 2. Dyspareunia. 3. History of endometriosis.  PRINCIPLE PROCEDURES:  Laparoscopic-assisted vaginal hysterectomy with left salpingo-oophorectomy and right salpingectomy.  HOSPITAL COURSE:  Barbara Maldonado is a 48 year old, white female, who presented to [facility] for laparoscopic-assisted vaginal hysterectomy with left salpingo-oophorectomy and right salpingectomy, secondary to multiple year history of worsening pelvic pain.  The patient did have a history of endometriosis in the past.  She was treated with Lupron which did help, however she states in the last 2 years, the pain has persistently gotten worse.  It is interfering with her relationships and her job.  She desires to have a hysterectomy in attempt to resolve this pain.  The patient was admitted, underwent a laparoscopic-assisted vaginal hysterectomy with left salpingo-oophorectomy and right salpingectomy.  A complete description of this can be found in dictated operative note. The patient's pathology was benign.  The patient's postop hemoglobin was 10.1.  At the time of discharge, the patient had adequate pain control. She was voiding well.  She was ambulating without difficulty.  She was eating a regular diet.  She did have flatus and normal bowel sounds. Her incision appeared to be healing well.  She had no vaginal bleeding. The patient was discharged to home.  She was instructed to follow up in the office in 4 weeks.  She was sent home with Percocet to take p.r.n. She was told to call the office with any vaginal bleeding, severe pain, or  fever.          ______________________________ Freda Munro, M.D.     MA/MEDQ  D:  06/17/2013  T:  06/18/2013  Job:  [id number]

## 2013-06-18 NOTE — Op Note (Signed)
Barbara Maldonado, Barbara Maldonado                ACCOUNT NO.:  [id number]  MEDICAL RECORD NO.:  [id number]  LOCATION:  5956                          FACILITY:  [facility]  PHYSICIAN:  Freda Munro, M.D.    DATE OF BIRTH:  01-02-1966  DATE OF PROCEDURE: DATE OF DISCHARGE:  06/09/2013                              OPERATIVE REPORT   PREOPERATIVE DIAGNOSES: 1. Chronic pelvic pain. 2. Dyspareunia. 3. History of endometriosis.  POSTOPERATIVE DIAGNOSES: 1. Chronic pelvic pain. 2. Dyspareunia. 3. History of endometriosis.  PROCEDURE:  Laparoscopic assisted vaginal hysterectomy with left salpingo-oophorectomy and right salpingectomy.  SURGEON:  Freda Munro, MD  ASSISTANT:  Barbaraann Rondo, MD  ANESTHESIA:  General and local.  ANTIBIOTICS:  Ancef.  ESTIMATED BLOOD LOSS:  Minimal.  COMPLICATIONS:  None.  FINDINGS:  On laparoscopy, the patient had no evidence of any pelvic adhesions.  She had no evidence of endometriosis.  She had no obvious etiology for her chronic left lower quadrant pain.  Prior to having the procedure performed, she expressed her strong desire for removal of the ovary even if it appeared to be normal.  PROCEDURE:  The patient was taken to the operating room, where she was placed in dorsal supine position.  A general anesthetic was administered without difficulty.  She was then placed in dorsal lithotomy position. She was prepped and draped in the usual fashion for this procedure.  Her bladder was drained with a red rubber catheter.  A Hulka tenaculum was applied to the anterior cervical lip.  Her umbilicus was injected with 1% lidocaine.  A vertical skin incision was made.  The fascia was grasped and entered, and the scope placed into the abdominal cavity.  On examination, the findings were as noted above.  At this point, 5 mm ports were placed in the right and left lower quadrants under direct visualization.  The ureters were identified bilaterally.  The infundibulopelvic  ligament on the left was then identified, cauterized, and transected.  The round ligament was cauterized and transected.  The remaining broad ligament was cauterized and transected down to the level of the uterine artery.  On the right, the mesosalpinx was cauterized and transected.  The ovarian ligament cauterized and transected.  The round ligament cauterized and transected.  The remaining broad ligament, cauterized, and transected down to the uterine artery also.  At this point, the laparoscopic procedure was concluded and attention focused on the vagina.  A weighted speculum was placed in the vagina.  The cervix was injected with 1% lidocaine with epinephrine.  The posterior cul-de- sac was entered sharply.  Uterosacral ligaments were bilaterally clamped, cut, and ligated with 0 Monocryl suture.  The cervix was then circumscribed.  The anterior cul-de-sac was entered sharply.  The bladder pillars were bilaterally clamped, cut, ligated with 0 Monocryl suture.  The cardinal ligaments were serially clamped, cut, and ligated with 0 Monocryl suture.  The uterine vessels were bilaterally clamped, cut, and ligated with 0 Monocryl suture.  The specimen was then removed. All pedicles were checked and felt to be hemostatic.  The posterior cuff was then run using 2-0 Vicryl in a running, locking fashion.  The uterosacral ligaments were  brought to the midline using a 2-0 Monocryl suture.  The remaining vaginal cuff was closed using 2-0 Vicryl in a running, locking fashion.  Hemostasis was checked and felt to be adequate.  At this point, a Foley catheter was placed and urine was noted to be clear.  The scope was then placed back in the abdominal cavity and the abdomen reinsufflated.  Again, no evidence of bleeding was found.  The pelvis was copiously irrigated.  This concluded the procedure.  The instruments were removed. Pneumoperitoneum released.  The fascia was closed with 0 Vicryl suture and  the skin with Dermabond.  The patient was awakened and taken to the recovery room in stable condition.  Instrument and lap counts correct x2.          ______________________________ Freda Munro, M.D.     MA/MEDQ  D:  06/17/2013  T:  06/18/2013  Job:  [id number]

## 2013-08-17 ENCOUNTER — Ambulatory Visit (INDEPENDENT_AMBULATORY_CARE_PROVIDER_SITE_OTHER): Payer: 59 | Admitting: Family

## 2013-08-17 ENCOUNTER — Encounter: Payer: Self-pay | Admitting: Family

## 2013-08-17 VITALS — BP 98/60 | HR 66 | Wt 134.0 lb

## 2013-08-17 DIAGNOSIS — F411 Generalized anxiety disorder: Secondary | ICD-10-CM

## 2013-08-17 DIAGNOSIS — J309 Allergic rhinitis, unspecified: Secondary | ICD-10-CM

## 2013-08-17 DIAGNOSIS — G43909 Migraine, unspecified, not intractable, without status migrainosus: Secondary | ICD-10-CM

## 2013-08-17 MED ORDER — FLUOXETINE HCL 20 MG PO CAPS: ORAL_CAPSULE | ORAL | Status: DC

## 2013-08-17 NOTE — Progress Notes (Signed)
Subjective:    Patient ID: Barbara Maldonado, female    DOB: [birth year], 48 y.o.   MRN: [id number]  HPI  48 year old white female, nonsmoker, is in today for recheck of migraine headaches, anxiety, and allergic rhinitis. Reports he is doing well. Denies any concerns.  Review of Systems  Constitutional: Negative.   HENT: Negative.   Respiratory: Negative.   Cardiovascular: Negative.   Gastrointestinal: Negative.   Endocrine: Negative.   Genitourinary: Negative.   Musculoskeletal: Negative.   Skin: Negative.   Neurological: Negative.   Psychiatric/Behavioral: Negative.    Past Medical History  Diagnosis Date  . Migraine   . Anxiety   . Depression   . PONV (postoperative nausea and vomiting)     History   Social History  . Marital Status: Married    Spouse Name: N/A    Number of Children: N/A  . Years of Education: N/A   Occupational History  . Not on file.   Social History Main Topics  . Smoking status: Never Smoker   . Smokeless tobacco: Not on file  . Alcohol Use: No  . Drug Use: No  . Sexual Activity: Not on file   Other Topics Concern  . Not on file   Social History Narrative  . No narrative on file    Past Surgical History  Procedure Laterality Date  . Bladder repair    . Tubal ligation    . Ablation    . Laparoscopic ovarian cystectomy    . Laparoscopic assisted vaginal hysterectomy N/A 06/08/2013    Procedure: LAPAROSCOPIC ASSISTED VAGINAL HYSTERECTOMY;  Surgeon: Olga Millers, MD;  Location: [facility];  Service: Gynecology;  Laterality: N/A;  . Bilateral salpingectomy Bilateral 06/08/2013    Procedure: BILATERAL SALPINGECTOMY;  Surgeon: Olga Millers, MD;  Location: [facility];  Service: Gynecology;  Laterality: Bilateral;  . Oophorectomy Left 06/08/2013    Procedure: OOPHORECTOMY;  Surgeon: Olga Millers, MD;  Location: [facility];  Service: Gynecology;  Laterality: Left;    No family history on file.  No Known Allergies  Current Outpatient  Prescriptions on File Prior to Visit  Medication Sig Dispense Refill  . cetirizine (ZYRTEC) 10 MG tablet Take 10 mg by mouth daily as needed.       . naproxen (NAPROSYN) 500 MG tablet Take 1 tablet (500 mg total) by mouth 2 (two) times daily with a meal.  30 tablet  3  . SUMAtriptan (IMITREX) 100 MG tablet Take 1 tablet (100 mg total) by mouth every 2 (two) hours as needed for migraine. Take 1 naprosyn with therapy  10 tablet  3  . topiramate (TOPAMAX) 100 MG tablet Take 1 tablet (100 mg total) by mouth 2 (two) times daily.  180 tablet  1  . oxyCODONE-acetaminophen (PERCOCET/ROXICET) 5-325 MG per tablet Take 1-2 tablets by mouth every 4 (four) hours as needed for severe pain (moderate to severe pain (when tolerating fluids)).  30 tablet  0   No current facility-administered medications on file prior to visit.    BP 98/60  Pulse 66  Wt 134 lb (60.782 kg)chart    Objective:   Physical Exam  Constitutional: She is oriented to person, place, and time. She appears well-developed and well-nourished.  HENT:  Right Ear: External ear normal.  Left Ear: External ear normal.  Nose: Nose normal.  Mouth/Throat: Oropharynx is clear and moist.  Neck: Normal range of motion. Neck supple.  Cardiovascular: Normal rate, regular rhythm and normal heart sounds.  Pulmonary/Chest: Effort normal and breath sounds normal.  Musculoskeletal: Normal range of motion.  Neurological: She is alert and oriented to person, place, and time.  Skin: Skin is warm and dry.  Psychiatric: She has a normal mood and affect.          Assessment & Plan:  Emmagene was seen today for no specified reason.  Diagnoses and associated orders for this visit:  Generalized anxiety disorder  Migraine headache  Allergic rhinitis  Other Orders - FLUoxetine (PROZAC) 20 MG capsule; TAKE 1 CAPSULE BY MOUTH DAILY.   Rehceckfor a complete physical exam in 6 months or sooner as needed.

## 2013-08-17 NOTE — Progress Notes (Signed)
Pre visit review using our clinic review tool, if applicable. No additional management support is needed unless otherwise documented below in the visit note. 

## 2013-12-14 DIAGNOSIS — M7500 Adhesive capsulitis of unspecified shoulder: Secondary | ICD-10-CM | POA: Insufficient documentation

## 2014-02-08 ENCOUNTER — Other Ambulatory Visit (INDEPENDENT_AMBULATORY_CARE_PROVIDER_SITE_OTHER): Payer: 59

## 2014-02-08 DIAGNOSIS — Z0000 Encounter for general adult medical examination without abnormal findings: Secondary | ICD-10-CM

## 2014-02-08 LAB — CBC WITH DIFFERENTIAL/PLATELET
Basophils Absolute: 0 10*3/uL (ref 0.0–0.1)
Basophils Relative: 0.4 % (ref 0.0–3.0)
EOS ABS: 0.1 10*3/uL (ref 0.0–0.7)
EOS PCT: 0.9 % (ref 0.0–5.0)
HCT: 37.3 % (ref 36.0–46.0)
Hemoglobin: 12.5 g/dL (ref 12.0–15.0)
Lymphocytes Relative: 29.7 % (ref 12.0–46.0)
Lymphs Abs: 1.9 10*3/uL (ref 0.7–4.0)
MCHC: 33.4 g/dL (ref 30.0–36.0)
MCV: 92.1 fl (ref 78.0–100.0)
MONO ABS: 0.3 10*3/uL (ref 0.1–1.0)
Monocytes Relative: 5.4 % (ref 3.0–12.0)
NEUTROS PCT: 63.6 % (ref 43.0–77.0)
Neutro Abs: 4.1 10*3/uL (ref 1.4–7.7)
PLATELETS: 335 10*3/uL (ref 150.0–400.0)
RBC: 4.05 Mil/uL (ref 3.87–5.11)
RDW: 12.7 % (ref 11.5–15.5)
WBC: 6.5 10*3/uL (ref 4.0–10.5)

## 2014-02-08 LAB — POCT URINALYSIS DIPSTICK
BILIRUBIN UA: NEGATIVE
Blood, UA: NEGATIVE
GLUCOSE UA: NEGATIVE
Ketones, UA: NEGATIVE
LEUKOCYTES UA: NEGATIVE
Nitrite, UA: NEGATIVE
Spec Grav, UA: 1.02
Urobilinogen, UA: 0.2
pH, UA: 5.5

## 2014-02-08 LAB — LIPID PANEL
CHOLESTEROL: 193 mg/dL (ref 0–200)
HDL: 58.2 mg/dL (ref >39.00)
LDL Cholesterol: 121 mg/dL — ABNORMAL HIGH (ref 0–99)
NonHDL: 134.8
Total CHOL/HDL Ratio: 3
Triglycerides: 70 mg/dL (ref 0.0–149.0)
VLDL: 14 mg/dL (ref 0.0–40.0)

## 2014-02-08 LAB — HEPATIC FUNCTION PANEL
ALT: 14 U/L (ref 0–35)
AST: 19 U/L (ref 0–37)
Albumin: 4.2 g/dL (ref 3.5–5.2)
Alkaline Phosphatase: 49 U/L (ref 39–117)
BILIRUBIN DIRECT: 0 mg/dL (ref 0.0–0.3)
BILIRUBIN TOTAL: 0.4 mg/dL (ref 0.2–1.2)
Total Protein: 7.4 g/dL (ref 6.0–8.3)

## 2014-02-08 LAB — BASIC METABOLIC PANEL
BUN: 17 mg/dL (ref 6–23)
CO2: 25 mEq/L (ref 19–32)
CREATININE: 0.9 mg/dL (ref 0.4–1.2)
Calcium: 9.4 mg/dL (ref 8.4–10.5)
Chloride: 107 mEq/L (ref 96–112)
GFR: 71.48 mL/min (ref >60.00)
Glucose, Bld: 80 mg/dL (ref 70–99)
Potassium: 3.9 mEq/L (ref 3.5–5.1)
Sodium: 139 mEq/L (ref 135–145)

## 2014-02-08 LAB — TSH: TSH: 1.83 u[IU]/mL (ref 0.35–4.50)

## 2014-02-17 ENCOUNTER — Encounter: Payer: 59 | Admitting: Family

## 2014-02-22 ENCOUNTER — Ambulatory Visit (INDEPENDENT_AMBULATORY_CARE_PROVIDER_SITE_OTHER): Payer: 59 | Admitting: Family

## 2014-02-22 ENCOUNTER — Encounter: Payer: Self-pay | Admitting: Family

## 2014-02-22 VITALS — BP 118/80 | Temp 98.0°F | Ht 62.75 in | Wt 135.5 lb

## 2014-02-22 DIAGNOSIS — R5383 Other fatigue: Secondary | ICD-10-CM

## 2014-02-22 DIAGNOSIS — Z0000 Encounter for general adult medical examination without abnormal findings: Secondary | ICD-10-CM

## 2014-02-22 DIAGNOSIS — R5381 Other malaise: Secondary | ICD-10-CM

## 2014-02-22 DIAGNOSIS — G44009 Cluster headache syndrome, unspecified, not intractable: Secondary | ICD-10-CM

## 2014-02-22 MED ORDER — NAPROXEN 500 MG PO TABS: 500.0000 mg | ORAL_TABLET | Freq: Two times a day (BID) | ORAL | Status: DC

## 2014-02-22 MED ORDER — TOPIRAMATE 100 MG PO TABS: 100.0000 mg | ORAL_TABLET | Freq: Two times a day (BID) | ORAL | Status: DC

## 2014-02-22 MED ORDER — SUMATRIPTAN SUCCINATE 100 MG PO TABS: 100.0000 mg | ORAL_TABLET | ORAL | Status: DC | PRN

## 2014-02-22 MED ORDER — FLUOXETINE HCL 20 MG PO CAPS: ORAL_CAPSULE | ORAL | Status: DC

## 2014-02-22 NOTE — Patient Instructions (Signed)

## 2014-02-22 NOTE — Progress Notes (Signed)
Pre visit review using our clinic review tool, if applicable. No additional management support is needed unless otherwise documented below in the visit note. 

## 2014-02-23 ENCOUNTER — Encounter: Payer: Self-pay | Admitting: Family

## 2014-02-23 NOTE — Progress Notes (Signed)
Subjective:    Patient ID: Barbara Maldonado, female    DOB: 09-04-1965, 48 y.o.   MRN: [id number]  HPI 48 year old white female, nonsmoker with a history of migraine headaches, anxiety is in today for complete physical exam. Has concerns of fatigue ongoing for several years. Reports she does not rest well at night. However, and had a normal sleep study done. Exercises daily. 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 Last Pap smear 2014. It was normal. Mammogram is due in March.    Review of Systems  Constitutional: Positive for fatigue.  HENT: Negative.   Eyes: Negative.   Respiratory: Negative.   Cardiovascular: Negative.   Gastrointestinal: Negative.   Endocrine: Negative.   Genitourinary: Negative.   Musculoskeletal: Negative.   Skin: Negative.   Allergic/Immunologic: Negative.   Neurological: Negative.   Hematological: Negative.   Psychiatric/Behavioral: Negative.   All other systems reviewed and are negative.  Past Medical History  Diagnosis Date  . Migraine   . Anxiety   . Depression   . PONV (postoperative nausea and vomiting)     History   Social History  . Marital Status: Married    Spouse Name: N/A    Number of Children: N/A  . Years of Education: N/A   Occupational History  . Not on file.   Social History Main Topics  . Smoking status: Never Smoker   . Smokeless tobacco: Not on file  . Alcohol Use: No  . Drug Use: No  . Sexual Activity: Not on file   Other Topics Concern  . Not on file   Social History Narrative  . No narrative on file    Past Surgical History  Procedure Laterality Date  . Bladder repair    . Tubal ligation    . Ablation    . Laparoscopic ovarian cystectomy    . Laparoscopic assisted vaginal hysterectomy N/A 06/08/2013    Procedure: LAPAROSCOPIC ASSISTED VAGINAL HYSTERECTOMY;  Surgeon: Barbara Millers, MD;  Location: [facility];  Service: Gynecology;  Laterality: N/A;  . Bilateral salpingectomy Bilateral  06/08/2013    Procedure: BILATERAL SALPINGECTOMY;  Surgeon: Barbara Millers, MD;  Location: [facility];  Service: Gynecology;  Laterality: Bilateral;  . Oophorectomy Left 06/08/2013    Procedure: OOPHORECTOMY;  Surgeon: Barbara Millers, MD;  Location: [facility];  Service: Gynecology;  Laterality: Left;    No family history on file.  No Known Allergies  Current Outpatient Prescriptions on File Prior to Visit  Medication Sig Dispense Refill  . cetirizine (ZYRTEC) 10 MG tablet Take 10 mg by mouth daily as needed.        No current facility-administered medications on file prior to visit.    BP 118/80  Temp(Src) 98 F (36.7 C) (Oral)  Ht 5' 2.75" (1.594 m)  Wt 135 lb 8 oz (61.462 kg)  BMI 24.19 kg/m2chart    Objective:   Physical Exam  Constitutional: She is oriented to person, place, and time. She appears well-developed and well-nourished.  HENT:  Right Ear: External ear normal.  Left Ear: External ear normal.  Nose: Nose normal.  Mouth/Throat: Oropharynx is clear and moist.  Eyes: Conjunctivae and EOM are normal. Pupils are equal, round, and reactive to light.  Neck: Normal range of motion. Neck supple. No thyromegaly present.  Cardiovascular: Normal rate, regular rhythm and normal heart sounds.   Pulmonary/Chest: Effort normal and breath sounds normal. Right breast exhibits no inverted nipple, no mass, no nipple  discharge, no skin change and no tenderness. Left breast exhibits no inverted nipple, no mass, no nipple discharge, no skin change and no tenderness. Breasts are symmetrical.  Abdominal: Soft. Bowel sounds are normal.  Musculoskeletal: Normal range of motion. She exhibits no edema and no tenderness.  Neurological: She is alert and oriented to person, place, and time. She has normal reflexes.  Skin: Skin is warm and dry.  Psychiatric: She has a normal mood and affect.          Assessment & Plan:  Barbara Maldonado was seen today for annual exam.  Diagnoses and associated orders  for this visit:  Routine general medical examination at a health care facility - EKG 12-Lead  Migraine-cluster headache syndrome  Other malaise and fatigue - Vitamin B12  Other Orders - topiramate (TOPAMAX) 100 MG tablet; Take 1 tablet (100 mg total) by mouth 2 (two) times daily. - SUMAtriptan (IMITREX) 100 MG tablet; Take 1 tablet (100 mg total) by mouth every 2 (two) hours as needed for migraine. Take 1 naprosyn with therapy - FLUoxetine (PROZAC) 20 MG capsule; TAKE 1 CAPSULE BY MOUTH DAILY. - naproxen (NAPROSYN) 500 MG tablet; Take 1 tablet (500 mg total) by mouth 2 (two) times daily with a meal.   Call the office with any questions or concerns. Recheck as scheduled in 6 months and as needed.

## 2014-02-28 LAB — VITAMIN B12: Vitamin B-12: 273 pg/mL (ref 211–911)

## 2014-03-08 ENCOUNTER — Telehealth: Payer: Self-pay | Admitting: Family

## 2014-03-08 NOTE — Telephone Encounter (Signed)
Pt would results of labs done last monday

## 2014-03-08 NOTE — Telephone Encounter (Signed)
Pt aware of normal b12 results. Pt would like to know what the next step is as far as fatigue.  Per Padonda, have pt decrease Topomax to qd and let us know how that works. Pt verbalized understanding

## 2014-03-15 DIAGNOSIS — M7520 Bicipital tendinitis, unspecified shoulder: Secondary | ICD-10-CM | POA: Insufficient documentation

## 2014-03-15 DIAGNOSIS — M67919 Unspecified disorder of synovium and tendon, unspecified shoulder: Secondary | ICD-10-CM | POA: Insufficient documentation

## 2014-03-15 DIAGNOSIS — M719 Bursopathy, unspecified: Secondary | ICD-10-CM | POA: Insufficient documentation

## 2014-03-15 DIAGNOSIS — F32A Depression, unspecified: Secondary | ICD-10-CM | POA: Insufficient documentation

## 2014-03-15 DIAGNOSIS — F329 Major depressive disorder, single episode, unspecified: Secondary | ICD-10-CM | POA: Insufficient documentation

## 2014-07-28 ENCOUNTER — Encounter: Payer: Self-pay | Admitting: Family

## 2014-07-28 ENCOUNTER — Ambulatory Visit (INDEPENDENT_AMBULATORY_CARE_PROVIDER_SITE_OTHER): Payer: 59 | Admitting: Family

## 2014-07-28 VITALS — BP 108/62 | Temp 97.9°F | Wt 140.0 lb

## 2014-07-28 DIAGNOSIS — G43009 Migraine without aura, not intractable, without status migrainosus: Secondary | ICD-10-CM

## 2014-07-28 MED ORDER — ZONISAMIDE 25 MG PO CAPS: 25.0000 mg | ORAL_CAPSULE | Freq: Two times a day (BID) | ORAL | Status: DC

## 2014-07-28 NOTE — Patient Instructions (Signed)

## 2014-07-28 NOTE — Progress Notes (Signed)
Pre visit review using our clinic review tool, if applicable. No additional management support is needed unless otherwise documented below in the visit note. 

## 2014-07-28 NOTE — Progress Notes (Signed)
Subjective:    Patient ID: Barbara Maldonado, female    DOB: May 26, 1966, 49 y.o.   MRN: [id number]  HPI  49 year old white female, nonsmoker with a history of migraine headaches is in today requesting to switch therapy. She's currently on Topamax 100 mg once daily and continues to have fatigue and feeling groggy. She would like to try Zonegran. Has considered Inderal in the past but has orthostatic hypotension at times. Does not one anything that is potentially sedating.   Review of Systems  Constitutional: Negative.   Respiratory: Negative.   Cardiovascular: Negative.   Gastrointestinal: Negative.   Endocrine: Negative.   Genitourinary: Negative.   Musculoskeletal: Negative.   Skin: Negative.   Allergic/Immunologic: Negative.   Neurological: Positive for headaches. Negative for dizziness.  Hematological: Negative.   Psychiatric/Behavioral: Negative.    Past Medical History  Diagnosis Date  . Migraine   . Anxiety   . Depression   . PONV (postoperative nausea and vomiting)     History   Social History  . Marital Status: Married    Spouse Name: N/A  . Number of Children: N/A  . Years of Education: N/A   Occupational History  . Not on file.   Social History Main Topics  . Smoking status: Never Smoker   . Smokeless tobacco: Not on file  . Alcohol Use: No  . Drug Use: No  . Sexual Activity: Not on file   Other Topics Concern  . Not on file   Social History Narrative    Past Surgical History  Procedure Laterality Date  . Bladder repair    . Tubal ligation    . Ablation    . Laparoscopic ovarian cystectomy    . Laparoscopic assisted vaginal hysterectomy N/A 06/08/2013    Procedure: LAPAROSCOPIC ASSISTED VAGINAL HYSTERECTOMY;  Surgeon: Barbara Millers, MD;  Location: [facility];  Service: Gynecology;  Laterality: N/A;  . Bilateral salpingectomy Bilateral 06/08/2013    Procedure: BILATERAL SALPINGECTOMY;  Surgeon: Barbara Millers, MD;  Location: [facility];  Service:  Gynecology;  Laterality: Bilateral;  . Oophorectomy Left 06/08/2013    Procedure: OOPHORECTOMY;  Surgeon: Barbara Millers, MD;  Location: [facility];  Service: Gynecology;  Laterality: Left;    History reviewed. No pertinent family history.  No Known Allergies  Current Outpatient Prescriptions on File Prior to Visit  Medication Sig Dispense Refill  . FLUoxetine (PROZAC) 20 MG capsule TAKE 1 CAPSULE BY MOUTH DAILY. 90 capsule 1  . naproxen (NAPROSYN) 500 MG tablet Take 1 tablet (500 mg total) by mouth 2 (two) times daily with a meal. 90 tablet 3  . SUMAtriptan (IMITREX) 100 MG tablet Take 1 tablet (100 mg total) by mouth every 2 (two) hours as needed for migraine. Take 1 naprosyn with therapy 30 tablet 3  . cetirizine (ZYRTEC) 10 MG tablet Take 10 mg by mouth daily as needed.      No current facility-administered medications on file prior to visit.    BP 108/62 mmHg  Temp(Src) 97.9 F (36.6 C) (Oral)  Wt 140 lb (63.504 kg)chart    Objective:   Physical Exam  Constitutional: She is oriented to person, place, and time. She appears well-developed and well-nourished.  HENT:  Right Ear: External ear normal.  Left Ear: External ear normal.  Nose: Nose normal.  Mouth/Throat: Oropharynx is clear and moist.  Eyes: Pupils are equal, round, and reactive to light.  Neck: Normal range of motion. Neck supple. No thyromegaly present.  Cardiovascular:  Normal rate, regular rhythm and normal heart sounds.   Pulmonary/Chest: Effort normal and breath sounds normal.  Abdominal: Soft. Bowel sounds are normal.  Musculoskeletal: Normal range of motion.  Neurological: She is alert and oriented to person, place, and time.  Skin: Skin is warm and dry.  Psychiatric: She has a normal mood and affect.          Assessment & Plan:  Barbara Maldonado was seen today for other.  Diagnoses and all orders for this visit:  Migraine without aura and without status migrainosus, not intractable  Other orders -      zonisamide (ZONEGRAN) 25 MG capsule; Take 1 capsule (25 mg total) by mouth 2 (two) times daily.   Start Zonegran  25 mg twice a day. Recheck in 3-4 weeks. Continue Imitrex as needed. Call the office with any questions or concerns.

## 2014-09-17 ENCOUNTER — Other Ambulatory Visit: Payer: Self-pay | Admitting: Family

## 2014-10-03 ENCOUNTER — Encounter: Payer: Self-pay | Admitting: Family

## 2014-10-05 ENCOUNTER — Other Ambulatory Visit: Payer: Self-pay | Admitting: Family

## 2014-10-05 DIAGNOSIS — G43909 Migraine, unspecified, not intractable, without status migrainosus: Secondary | ICD-10-CM

## 2014-10-06 ENCOUNTER — Other Ambulatory Visit: Payer: Self-pay | Admitting: Family

## 2014-10-06 MED ORDER — ZONISAMIDE 50 MG PO CAPS
50.0000 mg | ORAL_CAPSULE | Freq: Two times a day (BID) | ORAL | Status: DC
Start: 2014-10-06 — End: 2014-11-10

## 2014-11-10 ENCOUNTER — Encounter: Payer: Self-pay | Admitting: Neurology

## 2014-11-10 ENCOUNTER — Ambulatory Visit (INDEPENDENT_AMBULATORY_CARE_PROVIDER_SITE_OTHER): Payer: 59 | Admitting: Neurology

## 2014-11-10 VITALS — BP 112/70 | HR 70 | Resp 16 | Ht 62.0 in | Wt 142.8 lb

## 2014-11-10 DIAGNOSIS — G43719 Chronic migraine without aura, intractable, without status migrainosus: Secondary | ICD-10-CM | POA: Diagnosis not present

## 2014-11-10 MED ORDER — NORTRIPTYLINE HCL 25 MG PO CAPS: 25.0000 mg | ORAL_CAPSULE | Freq: Every day | ORAL | Status: DC

## 2014-11-10 NOTE — Progress Notes (Signed)
NEUROLOGY CONSULTATION NOTE  ZAKYLA TONCHE MRN: [id number] DOB: May 10, 1966  Referring provider: Dutch Quint, FNP Primary care provider: Dutch Quint, FNP  Reason for consult:  migraine  HISTORY OF PRESENT ILLNESS: Barbara Maldonado is a 49 year old right-handed female who presents for migraine.  Records reviewed.  Onset:  childhood Location:  Left sided Quality:  Pounding, sharp Intensity:  9-10/10 Aura:  no Prodrome:  no Associated symptoms:  Nausea, photophobia, phonophobia, osmophobia, left ptosis.  Sometimes vomiting. Duration:  1-2 days Frequency:  1 to 2 times a week.  However, she also has daily headache Triggers/exacerbating factors:  none Relieving factors:  Hot towel on head, quiet Activity:  Cannot function 2 times a month  Past abortive therapy:  Excedrin, Maxalt, Relpax, Zomig po, Imitrex shot Past preventative therapy:  Topiramate 100mg   Current abortive therapy:  sumatriptan 100mg  with naproxen 500mg  (takes at least twice a week.  Also takes Tylenol and ibuprofen daily for daily headache. Current preventative therapy:  zonisamide 50mg  twice daily. Other medication:  fluoxetine 20mg  She does not want to use sprays.  She does not like prednisone.  Caffeine:  Coffee daily Alcohol:  no Smoker:  no Diet:  Good.  Drinks a lot of water Exercise:  Usually but not lately Depression/stress:  controlled Sleep hygiene:  Good but feels fatigued still during day. Family history of headache:  mom  PAST MEDICAL HISTORY: Past Medical History  Diagnosis Date  . Migraine   . Anxiety   . Depression   . PONV (postoperative nausea and vomiting)     PAST SURGICAL HISTORY: Past Surgical History  Procedure Laterality Date  . Bladder repair    . Tubal ligation    . Ablation    . Laparoscopic ovarian cystectomy    . Laparoscopic assisted vaginal hysterectomy N/A 06/08/2013    Procedure: LAPAROSCOPIC ASSISTED VAGINAL HYSTERECTOMY;  Surgeon: Olga Millers, MD;   Location: [facility];  Service: Gynecology;  Laterality: N/A;  . Bilateral salpingectomy Bilateral 06/08/2013    Procedure: BILATERAL SALPINGECTOMY;  Surgeon: Olga Millers, MD;  Location: [facility];  Service: Gynecology;  Laterality: Bilateral;  . Oophorectomy Left 06/08/2013    Procedure: OOPHORECTOMY;  Surgeon: Olga Millers, MD;  Location: [facility];  Service: Gynecology;  Laterality: Left;    MEDICATIONS: Current Outpatient Prescriptions on File Prior to Visit  Medication Sig Dispense Refill  . cetirizine (ZYRTEC) 10 MG tablet Take 10 mg by mouth daily as needed.     Marland Kitchen FLUoxetine (PROZAC) 20 MG capsule TAKE 1 CAPSULE BY MOUTH DAILY. 90 capsule 1  . naproxen (NAPROSYN) 500 MG tablet Take 1 tablet (500 mg total) by mouth 2 (two) times daily with a meal. 90 tablet 3  . SUMAtriptan (IMITREX) 100 MG tablet Take 1 tablet (100 mg total) by mouth every 2 (two) hours as needed for migraine. Take 1 naprosyn with therapy 30 tablet 3   No current facility-administered medications on file prior to visit.    ALLERGIES: No Known Allergies  FAMILY HISTORY: Family History  Problem Relation Age of Onset  . Multiple sclerosis Mother   . Migraines Mother   . Diabetes Mother   . Cancer Father     melanoma  . Hypertension Sister   . Hypertension Mother   . ADD / ADHD Son   . ADD / ADHD Daughter   . Cancer Paternal Grandmother     unknown    SOCIAL HISTORY: History   Social History  . Marital  Status: Married    Spouse Name: N/A  . Number of Children: N/A  . Years of Education: N/A   Occupational History  . Not on file.   Social History Main Topics  . Smoking status: Never Smoker   . Smokeless tobacco: Not on file  . Alcohol Use: No  . Drug Use: No  . Sexual Activity:    Partners: Male   Other Topics Concern  . Not on file   Social History Narrative    REVIEW OF SYSTEMS: Constitutional: No fevers, chills, or sweats, no generalized fatigue, change in appetite Eyes: No visual  changes, double vision, eye pain Ear, nose and throat: No hearing loss, ear pain, nasal congestion, sore throat Cardiovascular: No chest pain, palpitations Respiratory:  No shortness of breath at rest or with exertion, wheezes GastrointestinaI: No nausea, vomiting, diarrhea, abdominal pain, fecal incontinence Genitourinary:  No dysuria, urinary retention or frequency Musculoskeletal:  No neck pain, back pain Integumentary: No rash, pruritus, skin lesions Neurological: as above Psychiatric: No depression, insomnia, anxiety Endocrine: No palpitations, fatigue, diaphoresis, mood swings, change in appetite, change in weight, increased thirst Hematologic/Lymphatic:  No anemia, purpura, petechiae. Allergic/Immunologic: no itchy/runny eyes, nasal congestion, recent allergic reactions, rashes  PHYSICAL EXAM: Filed Vitals:   11/10/14 0944  BP: 112/70  Pulse: 70  Resp: 16   General: No acute distress Head:  Normocephalic/atraumatic Eyes:  fundi unremarkable, without vessel changes, exudates, hemorrhages or papilledema. Neck: supple, no paraspinal tenderness, full range of motion Back: No paraspinal tenderness Heart: regular rate and rhythm Lungs: Clear to auscultation bilaterally. Vascular: No carotid bruits. Neurological Exam: Mental status: alert and oriented to person, place, and time, recent and remote memory intact, fund of knowledge intact, attention and concentration intact, speech fluent and not dysarthric, language intact. Cranial nerves: CN I: not tested CN II: pupils equal, round and reactive to light, visual fields intact, fundi unremarkable, without vessel changes, exudates, hemorrhages or papilledema. CN III, IV, VI:  full range of motion, no nystagmus, no ptosis CN V: facial sensation intact CN VII: upper and lower face symmetric CN VIII: hearing intact CN IX, X: gag intact, uvula midline CN XI: sternocleidomastoid and trapezius muscles intact CN XII: tongue midline Bulk  & Tone: normal, no fasciculations. Motor:  5/5 throughout Sensation:  Temperature and vibration intact Deep Tendon Reflexes:  2+ throughout, toes downgoing Finger to nose testing:  No dysmetria Heel to shin:  No dysmetria Gait:  Normal station and stride.  Able to turn and walk in tandem. Romberg negative.  IMPRESSION: Chronic migraine without aura, intractable  PLAN: 1.  Discontinue zonisamide and start nortriptyline 25mg  at bedtime 2.  Stop Tylenol and Ibuprofen.  Take only sumatriptan with naproxen for migraine no more than 2 days out of the week 3.  Healthy diet and water 4.  Routine exercise 5.  Call in 4 weeks with update.  Follow up in 3 months.  45 minutes spent with patient face to face, over 50% spent discussing migraine prevention and management.  Thank you for allowing me to take part in the care of this patient.  Metta Clines, DO  CC:  Dutch Quint, FNP

## 2014-11-10 NOTE — Patient Instructions (Signed)
1.  Stop zonisamide 2.  Start nortriptyline 25mg  at bedtime.  CALL IN 4 WEEKS WITH UPDATE AND WE CAN ADJUST DOSE IF NEEDED. 3.  Stop ibuprofen and tylenol.  Limit use of sumatriptan plus naproxen to no more than 2 days out of the week. 4.  Stop caffeine 5.  Restart routine exercise.  Maintain healthy diet and drink plenty of water 6.  Follow up in 3 months.

## 2014-12-14 ENCOUNTER — Telehealth: Payer: Self-pay | Admitting: *Deleted

## 2014-12-14 NOTE — Telephone Encounter (Signed)
Please advise on below  

## 2014-12-14 NOTE — Telephone Encounter (Signed)
Patient called to let you know that she is not take the medication(nortriptyline) it increased her appetite  Call back number [phone]

## 2015-02-15 ENCOUNTER — Encounter: Payer: Self-pay | Admitting: Neurology

## 2015-02-15 ENCOUNTER — Ambulatory Visit (INDEPENDENT_AMBULATORY_CARE_PROVIDER_SITE_OTHER): Payer: 59 | Admitting: Neurology

## 2015-02-15 VITALS — BP 110/64 | HR 68 | Resp 18 | Ht 62.0 in | Wt 148.8 lb

## 2015-02-15 DIAGNOSIS — G43009 Migraine without aura, not intractable, without status migrainosus: Secondary | ICD-10-CM | POA: Diagnosis not present

## 2015-02-15 MED ORDER — TOPIRAMATE 50 MG PO TABS: ORAL_TABLET | ORAL | Status: DC

## 2015-02-15 NOTE — Progress Notes (Signed)
NEUROLOGY FOLLOW UP OFFICE NOTE  Barbara Maldonado [id number]  HISTORY OF PRESENT ILLNESS: Barbara Maldonado is a 49 year old right-handed female who follows up for migraine.  UPDATE: She stopped nortriptyline because it increased her appetite.  But she stopped the medication overuse and she has seen improvement. Duration:  50% of time, they respond to sumatriptan/naproxen after 2 hours or so.  50% of time, she needs to repeat dose of sumatriptan Frequency:  They still occur 1-2 days per week but they are less severe. Current abortive medication:  sumatriptan 100mg  with naproxen 500mg   Caffeine:  Coffee daily Alcohol:  no Smoker:  no Diet:  Good.  Drinks a lot of water Exercise:  Usually but not lately Depression/stress:  controlled Sleep hygiene:  Good but feels fatigued still during day.  HISTORY: Onset:  childhood Location:  Left sided Quality:  Pounding, sharp Initial Intensity:  9-10/10 Aura:  no Prodrome:  no Associated symptoms:  Nausea, photophobia, phonophobia, osmophobia, left ptosis.  Sometimes vomiting. Initial Duration:  1-2 days Initial Frequency:  1 to 2 times a week.  However, she also has daily headache Triggers/exacerbating factors:  none Relieving factors:  Hot towel on head, quiet Activity:  Cannot function 2 times a month  Past abortive therapy:  Excedrin, Maxalt, Relpax, Zomig po, Imitrex shot, Tylenol, ibuprofen Past preventative therapy:  Topiramate 100mg , zonismaide 50mg   Family history of headache:  mom  PAST MEDICAL HISTORY: Past Medical History  Diagnosis Date  . Migraine   . Anxiety   . Depression   . PONV (postoperative nausea and vomiting)     MEDICATIONS: Current Outpatient Prescriptions on File Prior to Visit  Medication Sig Dispense Refill  . cetirizine (ZYRTEC) 10 MG tablet Take 10 mg by mouth daily as needed.     Marland Kitchen FLUoxetine (PROZAC) 20 MG capsule TAKE 1 CAPSULE BY MOUTH DAILY. 90 capsule 1  . naproxen (NAPROSYN) 500 MG tablet  Take 1 tablet (500 mg total) by mouth 2 (two) times daily with a meal. 90 tablet 3  . nortriptyline (PAMELOR) 25 MG capsule Take 1 capsule (25 mg total) by mouth at bedtime. (Patient not taking: Reported on 02/15/2015) 30 capsule 0  . SUMAtriptan (IMITREX) 100 MG tablet Take 1 tablet (100 mg total) by mouth every 2 (two) hours as needed for migraine. Take 1 naprosyn with therapy 30 tablet 3   No current facility-administered medications on file prior to visit.    ALLERGIES: No Known Allergies  FAMILY HISTORY: Family History  Problem Relation Age of Onset  . Multiple sclerosis Mother   . Migraines Mother   . Diabetes Mother   . Cancer Father     melanoma  . Hypertension Sister   . Hypertension Mother   . ADD / ADHD Son   . ADD / ADHD Daughter   . Cancer Paternal Grandmother     unknown    SOCIAL HISTORY: Social History   Social History  . Marital Status: Married    Spouse Name: N/A  . Number of Children: N/A  . Years of Education: N/A   Occupational History  . Not on file.   Social History Main Topics  . Smoking status: Never Smoker   . Smokeless tobacco: Not on file  . Alcohol Use: No  . Drug Use: No  . Sexual Activity:    Partners: Male   Other Topics Concern  . Not on file   Social History Narrative    REVIEW OF SYSTEMS: Constitutional:  No fevers, chills, or sweats, no generalized fatigue, change in appetite Eyes: No visual changes, double vision, eye pain Ear, nose and throat: No hearing loss, ear pain, nasal congestion, sore throat Cardiovascular: No chest pain, palpitations Respiratory:  No shortness of breath at rest or with exertion, wheezes GastrointestinaI: No nausea, vomiting, diarrhea, abdominal pain, fecal incontinence Genitourinary:  No dysuria, urinary retention or frequency Musculoskeletal:  No neck pain, back pain Integumentary: No rash, pruritus, skin lesions Neurological: as above Psychiatric: No depression, insomnia, anxiety Endocrine:  No palpitations, fatigue, diaphoresis, mood swings, change in appetite, change in weight, increased thirst Hematologic/Lymphatic:  No anemia, purpura, petechiae. Allergic/Immunologic: no itchy/runny eyes, nasal congestion, recent allergic reactions, rashes  PHYSICAL EXAM: Filed Vitals:   02/15/15 1133  BP: 110/64  Pulse: 68  Resp: 18   General: No acute distress.  Patient appears well-groomed.   Head:  Normocephalic/atraumatic Eyes:  Fundoscopic exam unremarkable without vessel changes, exudates, hemorrhages or papilledema. Neck: supple, no paraspinal tenderness, full range of motion Heart:  Regular rate and rhythm Lungs:  Clear to auscultation bilaterally Back: No paraspinal tenderness Neurological Exam: alert and oriented to person, place, and time. Attention span and concentration intact, recent and remote memory intact, fund of knowledge intact.  Speech fluent and not dysarthric, language intact.  CN II-XII intact. Fundoscopic exam unremarkable without vessel changes, exudates, hemorrhages or papilledema.  Bulk and tone normal, muscle strength 5/5 throughout.  Sensation to light touch, temperature and vibration intact.  Deep tendon reflexes 2+ throughout, toes downgoing.  Finger to nose and heel to shin testing intact.  Gait normal, Romberg negative.  IMPRESSION: Migraine without aura  PLAN: 1.  She felt that she did well on topiramate 100mg  twice daily, so we will restart it at 25mg  twice daily and titrate up to 100mg  twice daily 2.  Sumatriptan 100mg /naproxen 500mg  combination for abortive therapy 3.  Follow up in 3 months.  15 minutes spent face to face with patient, over 50% spent discussing management.  Metta Clines, DO  CC:  Dutch Quint, NP

## 2015-02-15 NOTE — Patient Instructions (Signed)
1.   We will restart topamax.  Will prescribe 50mg  tablets.  Take 1/2 tablet twice daily for 7 days, then 1 tablet twice daily for 7 days, then 1 and 1/2 tab twice daily for 7 days, then 2 tablets twice daily.   2.  Sumatriptan 100mg  with naproxen 500mg  as needed. 3.  Follow up in 3 months.

## 2015-03-24 ENCOUNTER — Other Ambulatory Visit: Payer: Self-pay | Admitting: Family

## 2015-03-26 ENCOUNTER — Other Ambulatory Visit: Payer: Self-pay | Admitting: Neurology

## 2015-03-27 MED ORDER — TOPIRAMATE 100 MG PO TABS: 100.0000 mg | ORAL_TABLET | Freq: Two times a day (BID) | ORAL | Status: DC

## 2015-03-27 NOTE — Telephone Encounter (Signed)
Ok to refill 

## 2015-03-27 NOTE — Telephone Encounter (Signed)
discontinued 50mg  and sent in new script for 100 mg twice a day

## 2015-03-27 NOTE — Telephone Encounter (Signed)
Please prescribe topiramate 100mg  tablets.  She is to take 1 tablet twice daily.  We may provide her with 6 refills.

## 2015-05-23 ENCOUNTER — Ambulatory Visit (INDEPENDENT_AMBULATORY_CARE_PROVIDER_SITE_OTHER): Payer: 59 | Admitting: Neurology

## 2015-05-23 ENCOUNTER — Encounter: Payer: Self-pay | Admitting: Neurology

## 2015-05-23 VITALS — BP 124/74 | HR 68 | Ht 63.0 in | Wt 143.0 lb

## 2015-05-23 DIAGNOSIS — G43009 Migraine without aura, not intractable, without status migrainosus: Secondary | ICD-10-CM | POA: Diagnosis not present

## 2015-05-23 NOTE — Patient Instructions (Signed)
Continue topiramate 100mg  twice daily May use Goodys.  You have the sumatriptan-naproxen combo on-hand if needed. Follow up in 6 months.

## 2015-05-23 NOTE — Progress Notes (Signed)
NEUROLOGY FOLLOW UP OFFICE NOTE  VANASSA SCHLINK [id number]  HISTORY OF PRESENT ILLNESS: Barbara Maldonado is a 49 year old right-handed female who follows up for migraine.  UPDATE: Improved overall, but a little increased now due to change in weather and family stress. Duration:  Starts to get better within 30 minutes of taking Goodys50% of time, they respond to sumatriptan/naproxen after 2 hours or so.  50% of time, she needs to repeat dose of sumatriptan Frequency:  Once every other week Current abortive medication:  Goody's (has not taken sumatriptan 100mg  with naproxen 500mg  in awhile) Current preventative medication:  topiramate 100mg  twice daily  Caffeine:  Coffee daily Alcohol:  no Smoker:  no Diet:  Good.  Drinks a lot of water Exercise:  Usually but not lately Depression/stress:  controlled Sleep hygiene:  Good but feels fatigued still during day.  HISTORY: Onset:  childhood Location:  Left sided Quality:  Pounding, sharp Initial Intensity:  9-10/10 Aura:  no Prodrome:  no Associated symptoms:  Nausea, photophobia, phonophobia, osmophobia, left ptosis.  Sometimes vomiting. Initial Duration:  1-2 days Initial Frequency:  1 to 2 times a week.  However, she also has daily headache Triggers/exacerbating factors:  none Relieving factors:  Hot towel on head, quiet Activity:  Cannot function 2 times a month  Past abortive therapy:  Excedrin, Maxalt, Relpax, Zomig po, Imitrex shot, Tylenol, ibuprofen Past preventative therapy:  Topiramate 100mg , zonismaide 50mg , nortriptyline (increased appetite)  Family history of headache:  mom  PAST MEDICAL HISTORY: Past Medical History  Diagnosis Date  . Migraine   . Anxiety   . Depression   . PONV (postoperative nausea and vomiting)     MEDICATIONS: Current Outpatient Prescriptions on File Prior to Visit  Medication Sig Dispense Refill  . cetirizine (ZYRTEC) 10 MG tablet Take 10 mg by mouth daily as needed.     Marland Kitchen FLUoxetine  (PROZAC) 20 MG capsule TAKE 1 CAPSULE BY MOUTH DAILY. 90 capsule 1  . topiramate (TOPAMAX) 100 MG tablet Take 1 tablet (100 mg total) by mouth 2 (two) times daily. 60 tablet 6   No current facility-administered medications on file prior to visit.    ALLERGIES: No Known Allergies  FAMILY HISTORY: Family History  Problem Relation Age of Onset  . Multiple sclerosis Mother   . Migraines Mother   . Diabetes Mother   . Cancer Father     melanoma  . Hypertension Sister   . Hypertension Mother   . ADD / ADHD Son   . ADD / ADHD Daughter   . Cancer Paternal Grandmother     unknown    SOCIAL HISTORY: Social History   Social History  . Marital Status: Married    Spouse Name: N/A  . Number of Children: N/A  . Years of Education: N/A   Occupational History  . Not on file.   Social History Main Topics  . Smoking status: Never Smoker   . Smokeless tobacco: Not on file  . Alcohol Use: No  . Drug Use: No  . Sexual Activity:    Partners: Male   Other Topics Concern  . Not on file   Social History Narrative    REVIEW OF SYSTEMS: Constitutional: No fevers, chills, or sweats, no generalized fatigue, change in appetite Eyes: No visual changes, double vision, eye pain Ear, nose and throat: No hearing loss, ear pain, nasal congestion, sore throat Cardiovascular: No chest pain, palpitations Respiratory:  No shortness of breath at rest or with  exertion, wheezes GastrointestinaI: No nausea, vomiting, diarrhea, abdominal pain, fecal incontinence Genitourinary:  No dysuria, urinary retention or frequency Musculoskeletal:  No neck pain, back pain Integumentary: No rash, pruritus, skin lesions Neurological: as above Psychiatric: No depression, insomnia, anxiety Endocrine: No palpitations, fatigue, diaphoresis, mood swings, change in appetite, change in weight, increased thirst Hematologic/Lymphatic:  No anemia, purpura, petechiae. Allergic/Immunologic: no itchy/runny eyes, nasal  congestion, recent allergic reactions, rashes  PHYSICAL EXAM: Filed Vitals:   05/23/15 1411  BP: 124/74  Pulse: 68   General: No acute distress.  Patient appears well-groomed.  normal body habitus. Head:  Normocephalic/atraumatic Eyes:  Fundoscopic exam unremarkable without vessel changes, exudates, hemorrhages or papilledema. Neck: supple, no paraspinal tenderness, full range of motion Heart:  Regular rate and rhythm Lungs:  Clear to auscultation bilaterally Back: No paraspinal tenderness Neurological Exam: alert and oriented to person, place, and time. Attention span and concentration intact, recent and remote memory intact, fund of knowledge intact.  Speech fluent and not dysarthric, language intact.  CN II-XII intact. Fundoscopic exam unremarkable without vessel changes, exudates, hemorrhages or papilledema.  Bulk and tone normal, muscle strength 5/5 throughout.  Sensation to light touch intact.  Deep tendon reflexes 2+ throughout.  Finger to nose testing intact.  Gait normal.  IMPRESSION: Migraine without aura  PLAN: topiramate 100mg  twice daily Goodys (sumatriptan-naproxen on hand if needed) Follow up in 6 months  15 minutes spent face to face with patient, over 50% spent discussing management.  Metta Clines, DO  CC:  Dutch Quint, FNP

## 2015-06-23 ENCOUNTER — Other Ambulatory Visit: Payer: Self-pay

## 2015-06-23 MED ORDER — TOPIRAMATE 100 MG PO TABS: 100.0000 mg | ORAL_TABLET | Freq: Two times a day (BID) | ORAL | Status: DC

## 2015-06-23 NOTE — Telephone Encounter (Signed)
Needs 90 day supply for insurance

## 2015-07-19 ENCOUNTER — Encounter: Payer: Self-pay | Admitting: Neurology

## 2015-09-14 ENCOUNTER — Encounter: Payer: Self-pay | Admitting: Family Medicine

## 2015-09-14 ENCOUNTER — Ambulatory Visit (INDEPENDENT_AMBULATORY_CARE_PROVIDER_SITE_OTHER): Payer: 59 | Admitting: Family Medicine

## 2015-09-14 ENCOUNTER — Telehealth: Payer: Self-pay | Admitting: Family

## 2015-09-14 VITALS — BP 130/70 | HR 66 | Temp 97.8°F | Resp 12 | Ht 63.0 in | Wt 144.0 lb

## 2015-09-14 DIAGNOSIS — R350 Frequency of micturition: Secondary | ICD-10-CM | POA: Diagnosis not present

## 2015-09-14 DIAGNOSIS — N3010 Interstitial cystitis (chronic) without hematuria: Secondary | ICD-10-CM | POA: Diagnosis not present

## 2015-09-14 DIAGNOSIS — R300 Dysuria: Secondary | ICD-10-CM

## 2015-09-14 LAB — POC URINALSYSI DIPSTICK (AUTOMATED)
BILIRUBIN UA: NEGATIVE
Blood, UA: NEGATIVE
GLUCOSE UA: NEGATIVE
KETONES UA: NEGATIVE
NITRITE UA: NEGATIVE
Protein, UA: NEGATIVE
SPEC GRAV UA: 1.015
Urobilinogen, UA: 0.2
pH, UA: 7

## 2015-09-14 MED ORDER — NITROFURANTOIN MONOHYD MACRO 100 MG PO CAPS: 100.0000 mg | ORAL_CAPSULE | Freq: Two times a day (BID) | ORAL | Status: DC

## 2015-09-14 MED ORDER — NITROFURANTOIN MONOHYD MACRO 100 MG PO CAPS: 100.0000 mg | ORAL_CAPSULE | Freq: Two times a day (BID) | ORAL | Status: AC

## 2015-09-14 NOTE — Progress Notes (Signed)
Pre visit review using our clinic review tool, if applicable. No additional management support is needed unless otherwise documented below in the visit note. 

## 2015-09-14 NOTE — Telephone Encounter (Signed)
Rx sent to pharmacy and I verified pharmacy got Rx.

## 2015-09-14 NOTE — Patient Instructions (Addendum)
Symptoms could be caused by bladder irritation, interstitial cystitis, in which antibiotic treatment is not recommended. Increase fluid intake. If no contraindications Ibuprofen 400 mg with food 3 times per day x 5 days may help. Will follow urine culture and make further recommendations accordingly. Please follow with your pcp if symptoms persist despite treatment.

## 2015-09-14 NOTE — Progress Notes (Signed)
.   Subjective:    Barbara Maldonado is a 50 y.o. female who complains of 4 days of dysuria and urinary frequency. + Nocturia, once nightly since symptoms started. She had hx of UTI's, last one reported 5 years ago because she stopped drinking sodas. She resumed soda consumption about 2 weeks ago. . She denies any fever, chills, skin rash, vaginal bleeding or abnormal vaginal discharge. +Suprapubic pressure like pain, mild, no radiated. She denies cloudy urine or gross hematuria. Denies any history of pyelonephritis.  LMP s/p endometrial ablation.  Sexually active, no Hx of STD's.   She has not tried any OTC medication.   Review of Systems Constitutional: negative for fever, chills, abnormal wt loss Respiratory: Denies cough, dyspnea, or wheezing. Gastrointestinal: Denies changes in bowel habits, vomiting, or blood in stool. Has had mild nausea. Genitourinary:positive dysuria, urinary frequency and urgency.Negative gross hematuria, urinary incontinence. Musculoskeletal:Deneis associated arthralgias or back pain. Skin: no rash or ulcers.  Filed Vitals:   09/14/15 1111  BP: 130/70  Pulse: 66  Temp: 97.8 F (36.6 C)  Resp: 12       Objective:   GENERAL: vitals reviewed and listed above, alert, oriented, appears well hydrated and in no acute distress  EYE: conjunttiva clear, no drainage  MOUTH: Moist oral mucosa, no oropharyngeal erythema, no significant abnormalities  LUNGS: clear to auscultation bilaterally, no wheezes, rales or rhonchi.  ABDOMEN: No tenderness upon palpation, soft, no masses or megaly appreciated. No costovertebral angle tenderness bilateral.  EXTREMITIES: No LE edema bilateral.  PSYCH: pleasant and cooperative, no obvious depression or anxiety.  Laboratory:   Urine dipstick shows 1+ lek, rest otherwise negative.  Assessment:   Dysuria - Plan: POCT Urinalysis Dipstick (Automated), Culture, Urine  Frequency of urination - Plan: POCT Urinalysis  Dipstick (Automated)  Interstitial cystitis   Plan:    We discussed possible causes of symptoms, I think they are most likely related to IC. We discussed options, she would like to start empiric abx treatment, so Macrobid recommended. Will tailor treatment according to Ucx results. Avoid bladder irritants, increase fluid intake, Vit C or cranberry juice/pills may also help with symptoms. She was instructed about warning signs. F/U if symptoms persist.  Anupama Piehl G. Martin[place], MD [facility]

## 2015-09-14 NOTE — Telephone Encounter (Signed)
Pt saw Dr Martinique today Pt states the rx  nitrofurantoin, macrocrystal-monohydrate, (MACROBID) 100 MG capsule   Is not at the pharmacy. Can you call in asap? Thanks  [facility]/ [place]

## 2015-09-16 LAB — URINE CULTURE

## 2015-09-17 ENCOUNTER — Encounter: Payer: Self-pay | Admitting: Family Medicine

## 2015-10-04 ENCOUNTER — Other Ambulatory Visit: Payer: Self-pay | Admitting: Family

## 2015-10-04 NOTE — Telephone Encounter (Signed)
Ok to refill 

## 2015-10-04 NOTE — Telephone Encounter (Signed)
I saw Barbara Maldonado recently for an acute visit, 09/14/15 (UTI symptoms). A 30 days supply can be sent but she needs to follow before a new refill is needed. Thanks.

## 2015-11-02 ENCOUNTER — Other Ambulatory Visit: Payer: Self-pay | Admitting: Family Medicine

## 2015-11-21 ENCOUNTER — Encounter: Payer: Self-pay | Admitting: Neurology

## 2015-11-21 ENCOUNTER — Ambulatory Visit (INDEPENDENT_AMBULATORY_CARE_PROVIDER_SITE_OTHER): Payer: 59 | Admitting: Neurology

## 2015-11-21 VITALS — BP 98/66 | HR 60 | Ht 63.0 in | Wt 145.0 lb

## 2015-11-21 DIAGNOSIS — G43009 Migraine without aura, not intractable, without status migrainosus: Secondary | ICD-10-CM | POA: Diagnosis not present

## 2015-11-21 MED ORDER — VENLAFAXINE HCL ER 37.5 MG PO CP24: ORAL_CAPSULE | ORAL | Status: DC

## 2015-11-21 MED ORDER — SUMATRIPTAN SUCCINATE 100 MG PO TABS: ORAL_TABLET | ORAL | Status: DC

## 2015-11-21 MED ORDER — NAPROXEN SODIUM 550 MG PO TABS: ORAL_TABLET | ORAL | Status: DC

## 2015-11-21 NOTE — Progress Notes (Signed)
NEUROLOGY FOLLOW UP OFFICE NOTE  MYKELA KOFFORD [id number]  HISTORY OF PRESENT ILLNESS: Barbara Maldonado is a 50 year old right-handed female who follows up for migraine.  UPDATE: Intensity:  6.5-7/10 Duration:  With sumatriptan/naproxen, within 2 hours 50% of time, otherwise 4-6 hours, requiring repeating dose or taking Tylenol or Goodys (infrequently) Frequency:  Once a week Current NSAIDS:  naproxen (with sumatriptan) Current analgesics:  Goodys Current triptans:  sumatriptan 100mg  (with naproxen) Current anti-emetic:  no Current muscle relaxants:  no Current anti-anxiolytic:  no Current sleep aide:  no Current Antihypertensive medications:  no Current Antidepressant medications:  fluoxetine 20mg  Current Anticonvulsant medications:  topiramate 100mg  twice daily Current Vitamins/Herbal/Supplements:  no Current Antihistamines/Decongestants:  Zyrtec Other therapy:  no  Caffeine:  cut down Alcohol:  no Smoker:  no Diet:  Good.  Drinks a lot of water Exercise:  Usually but not lately Depression/stress:  controlled Sleep hygiene:  Good but feels fatigued still during day.  HISTORY: Onset:  childhood Location:  Left sided Quality:  Pounding, sharp Initial Intensity:  9-10/10 Aura:  no Prodrome:  no Associated symptoms:  Nausea, photophobia, phonophobia, osmophobia, left ptosis.  Sometimes vomiting. Initial Duration:  1-2 days; December 30 minutes Initial Frequency:  1 to 2 times a week.  However, she also has daily headache; December once every other week Triggers/exacerbating factors:  none Relieving factors:  Hot towel on head, quiet Activity:  Cannot function 2 times a month  Past abortive therapy:  Excedrin, Maxalt, Relpax, Zomig po, Imitrex shot, Tylenol, ibuprofen Past preventative therapy:  Topiramate 100mg , zonismaide 50mg , nortriptyline (increased appetite)  Family history of headache:  mom  PAST MEDICAL HISTORY: Past Medical History  Diagnosis Date  .  Migraine   . Anxiety   . Depression   . PONV (postoperative nausea and vomiting)     MEDICATIONS: Current Outpatient Prescriptions on File Prior to Visit  Medication Sig Dispense Refill  . cetirizine (ZYRTEC) 10 MG tablet Take 10 mg by mouth daily as needed.     . topiramate (TOPAMAX) 100 MG tablet Take 1 tablet (100 mg total) by mouth 2 (two) times daily. 180 tablet 1  . SUMAtriptan-naproxen (TREXIMET) 85-500 MG tablet Take 1 tablet by mouth every 2 (two) hours as needed for migraine. Reported on 11/21/2015     No current facility-administered medications on file prior to visit.    ALLERGIES: No Known Allergies  FAMILY HISTORY: Family History  Problem Relation Age of Onset  . Multiple sclerosis Mother   . Migraines Mother   . Diabetes Mother   . Cancer Father     melanoma  . Hypertension Sister   . Hypertension Mother   . ADD / ADHD Son   . ADD / ADHD Daughter   . Cancer Paternal Grandmother     unknown    SOCIAL HISTORY: Social History   Social History  . Marital Status: Married    Spouse Name: N/A  . Number of Children: N/A  . Years of Education: N/A   Occupational History  . Not on file.   Social History Main Topics  . Smoking status: Never Smoker   . Smokeless tobacco: Not on file  . Alcohol Use: No  . Drug Use: No  . Sexual Activity:    Partners: Male   Other Topics Concern  . Not on file   Social History Narrative    REVIEW OF SYSTEMS: Constitutional: No fevers, chills, or sweats, no generalized fatigue, change in appetite Eyes:  No visual changes, double vision, eye pain Ear, nose and throat: No hearing loss, ear pain, nasal congestion, sore throat Cardiovascular: No chest pain, palpitations Respiratory:  No shortness of breath at rest or with exertion, wheezes GastrointestinaI: No nausea, vomiting, diarrhea, abdominal pain, fecal incontinence Genitourinary:  No dysuria, urinary retention or frequency Musculoskeletal:  No neck pain, back  pain Integumentary: No rash, pruritus, skin lesions Neurological: as above Psychiatric: No depression, insomnia, anxiety Endocrine: No palpitations, fatigue, diaphoresis, mood swings, change in appetite, change in weight, increased thirst Hematologic/Lymphatic:  No purpura, petechiae. Allergic/Immunologic: no itchy/runny eyes, nasal congestion, recent allergic reactions, rashes  PHYSICAL EXAM: Filed Vitals:   11/21/15 1102  BP: 98/66  Pulse: 60   General: No acute distress.  Patient appears well-groomed.  normal body habitus. Head:  Normocephalic/atraumatic Eyes:  Fundi examined but not visualized Neck: supple, no paraspinal tenderness, full range of motion Heart:  Regular rate and rhythm Lungs:  Clear to auscultation bilaterally Back: No paraspinal tenderness Neurological Exam: alert and oriented to person, place, and time. Attention span and concentration intact, recent and remote memory intact, fund of knowledge intact.  Speech fluent and not dysarthric, language intact.  CN II-XII intact. Bulk and tone normal, muscle strength 5/5 throughout.  Sensation to light touch, temperature and vibration intact.  Deep tendon reflexes 2+ throughout, toes downgoing.  Finger to nose and heel to shin testing intact.  Gait normal, Romberg negative.  IMPRESSION: Migraine without aura  PLAN: 1.  To try and reduce intensity of headaches in order to make it more responsive to abortive therapy, will add a second preventative.  She cannot take an antihypertensive medication because her blood pressure is low at baseline.  We will taper off Prozac over the next week and then have her start venlafaxine XR 37.5mg  daily for 7 days, then increasing to 75mg  daily.  She will contact me in 6 weeks with update regarding headache control and depression. 2.  Continue topiramate 100mg  twice daily 3.  For abortive therapy, sumatriptan 100mg  with naproxen 550mg  4.  Follow up in 3 months.  15 minutes spent face to  face with patient, over 50% spent discussing management.  Metta Clines, DO  CC:  Dutch Quint, FNP

## 2015-11-21 NOTE — Patient Instructions (Signed)
1.  Take sumatriptan 100mg  with naproxen 550mg  at earliest onset of headache.  May repeat dose once in 2 hours if needed.  Do not exceed 2 doses in 24 hours.  2.  Take Prozac every other day for 1 week and then stop.   3.  Once you stopped the Prozac, start venlafaxine XR 37.5mg  daily for 7 days, then increase to 75mg  daily (total of 2 capsules daily) 4.  Contact me in 6 weeks with update and we can increase dose if needed. 5.  Follow up in 3 months.

## 2015-12-18 ENCOUNTER — Other Ambulatory Visit: Payer: Self-pay | Admitting: Neurology

## 2015-12-18 NOTE — Telephone Encounter (Signed)
Effexor refill requested. Per last office note- patient to remain on medication. Refill approved and sent to patient's pharmacy.

## 2015-12-27 ENCOUNTER — Telehealth: Payer: Self-pay | Admitting: Neurology

## 2015-12-27 MED ORDER — VENLAFAXINE HCL ER 75 MG PO CP24: 75.0000 mg | ORAL_CAPSULE | Freq: Every day | ORAL | Status: DC

## 2015-12-27 NOTE — Telephone Encounter (Signed)
XR 37.5mg  daily for 7 days, then increasing to 75mg  daily

## 2015-12-27 NOTE — Telephone Encounter (Signed)
Dustin Flock [id number]. She called to let Dr. Tomi Likens know the new medications were working. She said the medication is effexor. She said her insurance does take 90 day refills. She does not have enough left for 6 more weeks. She uses [facility] in [place]. Thank you

## 2016-01-04 ENCOUNTER — Other Ambulatory Visit: Payer: Self-pay | Admitting: Neurology

## 2016-01-04 NOTE — Telephone Encounter (Signed)
Rx sent 

## 2016-01-11 ENCOUNTER — Encounter: Payer: Self-pay | Admitting: Family Medicine

## 2016-01-11 ENCOUNTER — Ambulatory Visit (INDEPENDENT_AMBULATORY_CARE_PROVIDER_SITE_OTHER): Payer: 59 | Admitting: Family Medicine

## 2016-01-11 VITALS — BP 120/74 | HR 96 | Temp 98.2°F | Ht 63.0 in | Wt 144.5 lb

## 2016-01-11 DIAGNOSIS — J069 Acute upper respiratory infection, unspecified: Secondary | ICD-10-CM | POA: Diagnosis not present

## 2016-01-11 DIAGNOSIS — R059 Cough, unspecified: Secondary | ICD-10-CM

## 2016-01-11 DIAGNOSIS — R05 Cough: Secondary | ICD-10-CM | POA: Diagnosis not present

## 2016-01-11 MED ORDER — BENZONATATE 100 MG PO CAPS: 200.0000 mg | ORAL_CAPSULE | Freq: Two times a day (BID) | ORAL | 0 refills | Status: AC | PRN

## 2016-01-11 MED ORDER — ALBUTEROL SULFATE HFA 108 (90 BASE) MCG/ACT IN AERS: 2.0000 | INHALATION_SPRAY | Freq: Four times a day (QID) | RESPIRATORY_TRACT | 0 refills | Status: DC

## 2016-01-11 NOTE — Progress Notes (Signed)
Pre visit review using our clinic review tool, if applicable. No additional management support is needed unless otherwise documented below in the visit note. 

## 2016-01-11 NOTE — Progress Notes (Signed)
ACUTE VISIT: Chief Complaint  Patient presents with  . Cough    started about 5-6 days ago, can't sleep at night. back/ribs sore from coughing.    HPI:   Barbara Maldonado is a 50 y.o.female here today complaining of 5-6 days of respiratory symptoms.  Productive cough, clear/yellowish sputum. Cough is worse at night, interfering with sleep and causes chest wall pain. She denies GERD history or heartburn.   She has not noted chest pain, dyspnea, or wheezing.  No Hx of recent travel. + Sick contact, granddaughter Dx recently with URI. No swimming in public pools, rivers, or lakes.  No known insect bite. + Hx of allergies, she takes Zyrtec as needed, has not done in a while.   She has not tried OTC Symptoms otherwise stable.     Review of Systems  Constitutional: Positive for fatigue. Negative for appetite change, chills and fever.  HENT: Positive for congestion, postnasal drip, rhinorrhea, sore throat and voice change. Negative for ear pain, facial swelling, nosebleeds and sinus pressure.   Eyes: Negative for photophobia, discharge and redness.  Respiratory: Positive for cough. Negative for shortness of breath and wheezing.   Gastrointestinal: Negative for abdominal pain, diarrhea, nausea and vomiting.  Musculoskeletal: Negative for myalgias and neck pain.  Skin: Negative for pallor and rash.  Neurological: Positive for headaches (History of migraines, stable). Negative for syncope and numbness.  Hematological: Negative for adenopathy. Does not bruise/bleed easily.  Psychiatric/Behavioral: Positive for sleep disturbance. The patient is nervous/anxious.       Current Outpatient Prescriptions on File Prior to Visit  Medication Sig Dispense Refill  . cetirizine (ZYRTEC) 10 MG tablet Take 10 mg by mouth daily as needed.     . naproxen sodium (ANAPROX) 550 MG tablet Take 1 tablet with each dose of sumatriptan 10 tablet 2  . SUMAtriptan (IMITREX) 100 MG tablet  Take 1 tab at earliest onset of headache.  May repeat in 2 hours if headache persists or recurs.  Do not exceed 2 tablets in 24 hours 10 tablet 2  . SUMAtriptan-naproxen (TREXIMET) 85-500 MG tablet Take 1 tablet by mouth every 2 (two) hours as needed for migraine. Reported on 11/21/2015    . topiramate (TOPAMAX) 100 MG tablet TAKE 1 TABLET (100 MG TOTAL) BY MOUTH 2 (TWO) TIMES DAILY. 180 tablet 1  . venlafaxine XR (EFFEXOR-XR) 75 MG 24 hr capsule Take 1 capsule (75 mg total) by mouth daily. 90 capsule 0   No current facility-administered medications on file prior to visit.      Past Medical History:  Diagnosis Date  . Anxiety   . Depression   . Migraine   . PONV (postoperative nausea and vomiting)    No Known Allergies  Social History   Social History  . Marital status: Married    Spouse name: N/A  . Number of children: N/A  . Years of education: N/A   Social History Main Topics  . Smoking status: Never Smoker  . Smokeless tobacco: None  . Alcohol use No  . Drug use: No  . Sexual activity: Yes    Partners: Male   Other Topics Concern  . None   Social History Narrative  . None    Vitals:   01/11/16 1531  BP: 120/74  Pulse: 96  Temp: 98.2 F (36.8 C)   Body mass index is 25.6 kg/m.   O2 SAT 98% RA   Physical Exam  Constitutional: She is oriented  to person, place, and time. She appears well-developed and well-nourished. She does not appear ill. No distress.  HENT:  Head: Atraumatic.  Right Ear: Hearing, external ear and ear canal normal. A middle ear effusion is present.  Left Ear: Hearing, tympanic membrane, external ear and ear canal normal.  Nose: No mucosal edema or rhinorrhea. Right sinus exhibits no maxillary sinus tenderness and no frontal sinus tenderness. Left sinus exhibits no maxillary sinus tenderness and no frontal sinus tenderness.  Mouth/Throat: Uvula is midline and mucous membranes are normal. Posterior oropharyngeal erythema (Mild) present. No  oropharyngeal exudate or posterior oropharyngeal edema.  Postnasal drainage. Mild dysphonia.  Eyes: Conjunctivae are normal.  Neck: No muscular tenderness present. No edema and no erythema present.  Cardiovascular: Normal rate and regular rhythm.   Respiratory: Effort normal. No respiratory distress. She has no wheezes. She has no rhonchi. She has no rales.  Cough tiggered by forced expiration. Prolonged expiration.   Lymphadenopathy:       Head (right side): No submandibular adenopathy present.       Head (left side): No submandibular adenopathy present.    She has cervical adenopathy (< 1 cm.).       Right cervical: Posterior cervical adenopathy present. No deep cervical adenopathy present.      Left cervical: Posterior cervical adenopathy present. No deep cervical adenopathy present.  Neurological: She is alert and oriented to person, place, and time. She has normal strength. Coordination and gait normal.  Skin: Skin is warm. No rash noted. No erythema.  Psychiatric: Her mood appears anxious.  Well groomed, good eye contact.      ASSESSMENT AND PLAN:     Nallely was seen today for cough.  Diagnoses and all orders for this visit:  URI, acute  Cough -     benzonatate (TESSALON) 100 MG capsule; Take 2 capsules (200 mg total) by mouth 2 (two) times daily as needed for cough. -     albuterol (PROVENTIL HFA;VENTOLIN HFA) 108 (90 Base) MCG/ACT inhaler; Inhale 2 puffs into the lungs every 6 (six) hours.   Symptoms suggests a viral etiology, I explained patient that symptomatic treatment is usually recommended in this case, so I do not think abx is needed at this time. Because prolonged expiration Albuterol was recommended for a week, explained evidence if not clear about benefits from bronchodilators but may help. I don't think imaging is needed at this time. She does not want to try intranasal steroids. Take Zyrtec 10 mg daily. Instructed to monitor for signs of complications,  including new onset of fever among some, clearly instructed about warning signs. I also explained that cough and nasal congestion can last a few days and sometimes weeks. F/U as needed.        -Ms. Darlin Coco advised to return or notify a doctor immediately if symptoms worsen or persist or new concerns arise, she voices understanding.       Jia Dottavio G. Martinique, MD  [facility]. [place] office.

## 2016-01-11 NOTE — Patient Instructions (Addendum)
A few things to remember from today's visit:   URI, acute  Cough - Plan: benzonatate (TESSALON) 100 MG capsule, albuterol (PROVENTIL HFA;VENTOLIN HFA) 108 (90 Base) MCG/ACT inhaler  viral infections are self-limited and we treat each symptom depending of severity.   Over the counter medications as decongestants and cold medications usually help, they need to be taken with caution if there is a history of high blood pressure or palpitations.  Tylenol and/or Ibuprofen also helps with most symptoms (headache, muscle aching, fever,etc) Plenty of fluids. Honey helps with cough.  Steam inhalations helps with runny nose, nasal congestion, and may prevent sinus infections. Cough and nasal congestion could last a few days and sometimes weeks.  Please follow in not any better in 1-2 weeks or if symptoms get worse.  Please be sure medication list is accurate. If a new problem present, please set up appointment sooner than planned today.

## 2016-01-12 ENCOUNTER — Telehealth: Payer: Self-pay | Admitting: Family

## 2016-01-12 MED ORDER — GUAIFENESIN-CODEINE 100-10 MG/5ML PO SOLN: 10.0000 mL | Freq: Every evening | ORAL | 0 refills | Status: DC | PRN

## 2016-01-12 NOTE — Telephone Encounter (Signed)
As I explained yesterday cough can last a few weeks. I usually do not prescribe cough medications different to Benzonatate because most are controlled medications. If she is concerned about cough CXR could be arranged.  [If she insists in cough medication you could call in for Codeine /guaifenesin 10 mg/100 ml to take just at bedtime and as needed 10 ml for up to 7 days # 70 ml/0].   If cough is worse or new associated symptom follow with PCP.  Thanks, BJ

## 2016-01-12 NOTE — Telephone Encounter (Signed)
Pt state the cough medication is not working for her she would like to have some the will help her sleep at night (she only had 3 hrs of sleep 01/11/16).    Pharm:  [place], [place]

## 2016-01-12 NOTE — Telephone Encounter (Signed)
Left a detailed message letting patient know that we did have a cough syrup for her to come pick up & to also let us know about the chest x-ray.

## 2016-03-04 ENCOUNTER — Ambulatory Visit (INDEPENDENT_AMBULATORY_CARE_PROVIDER_SITE_OTHER): Payer: 59 | Admitting: Neurology

## 2016-03-04 ENCOUNTER — Encounter: Payer: Self-pay | Admitting: Neurology

## 2016-03-04 VITALS — BP 124/82 | HR 64 | Temp 97.8°F | Ht 63.0 in | Wt 142.0 lb

## 2016-03-04 DIAGNOSIS — G43009 Migraine without aura, not intractable, without status migrainosus: Secondary | ICD-10-CM | POA: Diagnosis not present

## 2016-03-04 NOTE — Progress Notes (Signed)
NEUROLOGY FOLLOW UP OFFICE NOTE  Barbara Maldonado [id number]  HISTORY OF PRESENT ILLNESS: Barbara Maldonado is a 50 year old right-handed female who follows up for migraine.   UPDATE: A couple of weeks ago, she had 4 days of migraine accompanied with extreme anxiety presenting as static in her visual field and "whooshing" sound in her head.  It was triggered by family stressors.  Otherwise, she has been doing well Intensity:  6-7/10 Duration: brief with sumatriptan/naproxen Frequency:  Less than once a week Current NSAIDS:  naproxen 550mg  (with sumatriptan) Current analgesics:  no Current triptans:  sumatriptan 100mg  (with naproxen) Current anti-emetic:  no Current muscle relaxants:  no Current anti-anxiolytic:  no Current sleep aide:  no Current Antihypertensive medications:  no Current Antidepressant medications:  venlafaxine XR 75mg  Current Anticonvulsant medications:  topiramate 100mg  twice daily Current Vitamins/Herbal/Supplements:  no Current Antihistamines/Decongestants:  Zyrtec Other therapy:  no   Caffeine:  cut down Alcohol:  no Smoker:  no Diet:  Good.  Drinks a lot of water Exercise:  Usually but not lately Depression/stress:  controlled Sleep hygiene:  Good but feels fatigued still during day.   HISTORY: Onset:  childhood Location:  Left sided Quality:  Pounding, sharp Initial Intensity:  9-10/10; June: 6.5-7/10 Aura:  no Prodrome:  no Associated symptoms:  Nausea, photophobia, phonophobia, osmophobia, left ptosis.  Sometimes vomiting. Initial Duration:  1-2 days; December 30 minutes; June: With sumatriptan/naproxen, within 2 hours 50% of time, otherwise 4-6 hours, requiring repeating dose or taking Tylenol or Goodys (infrequently) Initial Frequency:  1 to 2 times a week.  However, she also has daily headache; June: Once a week Triggers/exacerbating factors:  none Relieving factors:  Hot towel on head, quiet Activity:  Cannot function 2 times a month   Past  abortive therapy:  Excedrin, Maxalt, Relpax, Zomig po, Imitrex shot, Tylenol, ibuprofen Past preventative therapy:  Topiramate 100mg , zonismaide 50mg , nortriptyline (increased appetite)   Family history of headache:  mom  PAST MEDICAL HISTORY: Past Medical History:  Diagnosis Date  . Anxiety   . Depression   . Migraine   . PONV (postoperative nausea and vomiting)     MEDICATIONS: Current Outpatient Prescriptions on File Prior to Visit  Medication Sig Dispense Refill  . cetirizine (ZYRTEC) 10 MG tablet Take 10 mg by mouth daily as needed.     Marland Kitchen guaiFENesin-codeine 100-10 MG/5ML syrup Take 10 mLs by mouth at bedtime as needed for cough. 70 mL 0  . naproxen sodium (ANAPROX) 550 MG tablet Take 1 tablet with each dose of sumatriptan 10 tablet 2  . SUMAtriptan (IMITREX) 100 MG tablet Take 1 tab at earliest onset of headache.  May repeat in 2 hours if headache persists or recurs.  Do not exceed 2 tablets in 24 hours 10 tablet 2  . SUMAtriptan-naproxen (TREXIMET) 85-500 MG tablet Take 1 tablet by mouth every 2 (two) hours as needed for migraine. Reported on 11/21/2015    . topiramate (TOPAMAX) 100 MG tablet TAKE 1 TABLET (100 MG TOTAL) BY MOUTH 2 (TWO) TIMES DAILY. 180 tablet 1  . venlafaxine XR (EFFEXOR-XR) 75 MG 24 hr capsule Take 1 capsule (75 mg total) by mouth daily. 90 capsule 0  . albuterol (PROVENTIL HFA;VENTOLIN HFA) 108 (90 Base) MCG/ACT inhaler Inhale 2 puffs into the lungs every 6 (six) hours. 1 Inhaler 0   No current facility-administered medications on file prior to visit.     ALLERGIES: No Known Allergies  FAMILY HISTORY: Family History  Problem  Relation Age of Onset  . Multiple sclerosis Mother   . Migraines Mother   . Diabetes Mother   . Hypertension Mother   . Cancer Father     melanoma  . Hypertension Sister   . ADD / ADHD Son   . ADD / ADHD Daughter   . Cancer Paternal Grandmother     unknown    SOCIAL HISTORY: Social History   Social History  . Marital  status: Married    Spouse name: N/A  . Number of children: N/A  . Years of education: N/A   Occupational History  . Not on file.   Social History Main Topics  . Smoking status: Never Smoker  . Smokeless tobacco: Never Used  . Alcohol use No  . Drug use: No  . Sexual activity: Yes    Partners: Male   Other Topics Concern  . Not on file   Social History Narrative  . No narrative on file    REVIEW OF SYSTEMS: Constitutional: No fevers, chills, or sweats, no generalized fatigue, change in appetite Eyes: No visual changes, double vision, eye pain Ear, nose and throat: No hearing loss, ear pain, nasal congestion, sore throat Cardiovascular: No chest pain, palpitations Respiratory:  No shortness of breath at rest or with exertion, wheezes GastrointestinaI: No nausea, vomiting, diarrhea, abdominal pain, fecal incontinence Genitourinary:  No dysuria, urinary retention or frequency Musculoskeletal:  No neck pain, back pain Integumentary: No rash, pruritus, skin lesions Neurological: as above Psychiatric: No depression, insomnia, anxiety Endocrine: No palpitations, fatigue, diaphoresis, mood swings, change in appetite, change in weight, increased thirst Hematologic/Lymphatic:  No purpura, petechiae. Allergic/Immunologic: no itchy/runny eyes, nasal congestion, recent allergic reactions, rashes  PHYSICAL EXAM: Vitals:   03/04/16 1106  BP: 124/82  Pulse: 64  Temp: 97.8 F (36.6 C)   General: No acute distress.  Patient appears well-groomed.  normal body habitus. Head:  Normocephalic/atraumatic Eyes:  Fundi examined but not visualized Neck: supple, no paraspinal tenderness, full range of motion Heart:  Regular rate and rhythm Lungs:  Clear to auscultation bilaterally Back: No paraspinal tenderness Neurological Exam: alert and oriented to person, place, and time. Attention span and concentration intact, recent and remote memory intact, fund of knowledge intact.  Speech fluent  and not dysarthric, language intact.  CN II-XII intact. Bulk and tone normal, muscle strength 5/5 throughout.  Sensation to light touch intact.  Deep tendon reflexes 2+ throughout.  Finger to nose and heel to shin testing intact.  Gait normal, Romberg negative.  IMPRESSION: migraine  PLAN: 1.  Continue venlafaxine XR 75mg  daily and topiramate 100mg  twice daily 2.  Sumatriptan 100mg  with naproxen 550mg  for abortive therapy 3.  Follow up in 5 months.  15 minutes spent face to face with patient, over 50% spent counseling.  Metta Clines, DO  CC:  Dutch Quint, FNP

## 2016-03-04 NOTE — Patient Instructions (Signed)
1.  Continue the venlafaxine XR 75mg  daily and topiramate 100mg  twice daily 2.  When you get a migraine, take the sumatriptan 100mg  with naproxen 550mg  3.  Follow up in 5 months.

## 2016-03-25 ENCOUNTER — Other Ambulatory Visit: Payer: Self-pay | Admitting: Neurology

## 2016-03-25 NOTE — Telephone Encounter (Signed)
1.  Continue venlafaxine XR 75mg  daily and topiramate 100mg  twice daily

## 2016-05-13 ENCOUNTER — Other Ambulatory Visit: Payer: Self-pay | Admitting: Obstetrics and Gynecology

## 2016-05-14 LAB — CYTOLOGY - PAP

## 2016-06-03 ENCOUNTER — Encounter: Payer: Self-pay | Admitting: Family Medicine

## 2016-06-03 ENCOUNTER — Ambulatory Visit (INDEPENDENT_AMBULATORY_CARE_PROVIDER_SITE_OTHER)
Admission: RE | Admit: 2016-06-03 | Discharge: 2016-06-03 | Disposition: A | Discharge status: Home / Self Care | Payer: 59 | Source: Ambulatory Visit | Attending: Family Medicine | Admitting: Family Medicine

## 2016-06-03 ENCOUNTER — Ambulatory Visit (INDEPENDENT_AMBULATORY_CARE_PROVIDER_SITE_OTHER): Payer: 59 | Admitting: Family Medicine

## 2016-06-03 VITALS — BP 112/70 | HR 100 | Temp 97.6°F | Resp 12 | Ht 63.0 in | Wt 146.2 lb

## 2016-06-03 DIAGNOSIS — R05 Cough: Secondary | ICD-10-CM

## 2016-06-03 DIAGNOSIS — R053 Chronic cough: Secondary | ICD-10-CM

## 2016-06-03 DIAGNOSIS — J309 Allergic rhinitis, unspecified: Secondary | ICD-10-CM

## 2016-06-03 MED ORDER — BUDESONIDE-FORMOTEROL FUMARATE 160-4.5 MCG/ACT IN AERO: 2.0000 | INHALATION_SPRAY | Freq: Two times a day (BID) | RESPIRATORY_TRACT | 3 refills | Status: DC

## 2016-06-03 MED ORDER — OMEPRAZOLE 20 MG PO CPDR: 20.0000 mg | DELAYED_RELEASE_CAPSULE | Freq: Every day | ORAL | 1 refills | Status: DC

## 2016-06-03 MED ORDER — FLUTICASONE PROPIONATE 50 MCG/ACT NA SUSP: 2.0000 | Freq: Every day | NASAL | 2 refills | Status: DC

## 2016-06-03 NOTE — Progress Notes (Signed)
Pre visit review using our clinic review tool, if applicable. No additional management support is needed unless otherwise documented below in the visit note. 

## 2016-06-03 NOTE — Progress Notes (Signed)
HPI:  ACUTE VISIT:  Chief Complaint  Patient presents with  . Cough    Barbara Maldonado is a 50 y.o. female, who is here today complaining of 4-5 months of persistent cough, worse for the past month after acute URI. She denies dyspnea or wheezing. She has not noted chills, fever, abnormal wt loss, or night sweats.   + Nasal congestion, rhinorrhea, sore throat with coughing spells, and post nasal drainage.  + Dysphonia. Denies tobacco use.  No Hx of overseas travel. Sick contact: Denies No known insect bite. +Hx of allergies.  Cough causes nausea and had an episode of vomiting today, "pinkish" color. She has not noted heartburn, abdominal pain, or changes in bowel habits. No Hx of GERD.  Medication OTC for this problem: Cough medications OTC.  Symptoms otherwise stable.      Review of Systems  Constitutional: Negative for activity change, appetite change, fatigue, fever and unexpected weight change.  HENT: Positive for congestion, postnasal drip, rhinorrhea and voice change. Negative for ear pain, mouth sores, sinus pressure, sneezing and trouble swallowing.   Eyes: Positive for itching. Negative for discharge and redness.  Respiratory: Positive for cough. Negative for shortness of breath and wheezing.   Cardiovascular: Negative for leg swelling.  Gastrointestinal: Positive for nausea and vomiting. Negative for abdominal pain and diarrhea.  Musculoskeletal: Negative for back pain, joint swelling, myalgias and neck pain.  Skin: Negative for rash.  Allergic/Immunologic: Positive for environmental allergies.  Neurological: Negative for syncope, weakness and headaches.  Hematological: Negative for adenopathy. Does not bruise/bleed easily.  Psychiatric/Behavioral: Negative for confusion. The patient is nervous/anxious.       Current Outpatient Prescriptions on File Prior to Visit  Medication Sig Dispense Refill  . cetirizine (ZYRTEC) 10 MG tablet Take 10 mg by  mouth daily as needed.     . SUMAtriptan-naproxen (TREXIMET) 85-500 MG tablet Take 1 tablet by mouth every 2 (two) hours as needed for migraine. Reported on 11/21/2015    . topiramate (TOPAMAX) 100 MG tablet TAKE 1 TABLET (100 MG TOTAL) BY MOUTH 2 (TWO) TIMES DAILY. 180 tablet 1  . venlafaxine XR (EFFEXOR-XR) 75 MG 24 hr capsule TAKE 1 CAPSULE BY MOUTH DAILY. 90 capsule 1   No current facility-administered medications on file prior to visit.      Past Medical History:  Diagnosis Date  . Anxiety   . Depression   . Migraine   . PONV (postoperative nausea and vomiting)    No Known Allergies  Social History   Social History  . Marital status: Married    Spouse name: N/A  . Number of children: N/A  . Years of education: N/A   Social History Main Topics  . Smoking status: Never Smoker  . Smokeless tobacco: Never Used  . Alcohol use No  . Drug use: No  . Sexual activity: Yes    Partners: Male   Other Topics Concern  . None   Social History Narrative  . None    Vitals:   06/03/16 1537  BP: 112/70  Pulse: 100  Resp: 12  Temp: 97.6 F (36.4 C)    O2 sat at RA 98%   Body mass index is 25.91 kg/m.    Physical Exam  Nursing note and vitals reviewed. Constitutional: She is oriented to person, place, and time. She appears well-developed and well-nourished. She does not appear ill. No distress.  HENT:  Head: Atraumatic.  Right Ear: Tympanic membrane, external ear and ear  canal normal.  Left Ear: Tympanic membrane, external ear and ear canal normal.  Nose: Rhinorrhea present. Right sinus exhibits no maxillary sinus tenderness and no frontal sinus tenderness. Left sinus exhibits no maxillary sinus tenderness and no frontal sinus tenderness.  Mouth/Throat: Oropharynx is clear and moist and mucous membranes are normal.  Mild post nasal drainage. Mild dysphonia.   Eyes: Conjunctivae are normal.  Cardiovascular: Normal rate and regular rhythm.   No murmur  heard. Respiratory: Effort normal and breath sounds normal. No stridor. No respiratory distress.  Non productive cough a couple times during OV.  GI: Soft. She exhibits no mass. There is no hepatomegaly. There is no tenderness.  Musculoskeletal: She exhibits no edema.  Lymphadenopathy:       Head (right side): No submandibular adenopathy present.       Head (left side): No submandibular adenopathy present.    She has no cervical adenopathy.  Neurological: She is alert and oriented to person, place, and time. She has normal strength.  Skin: Skin is warm. No rash noted. No erythema.  Psychiatric: Her speech is normal. Her affect is blunt.  Well groomed, good eye contact.      ASSESSMENT AND PLAN:     Barbara Maldonado was seen today for cough.  Diagnoses and all orders for this visit:    Cough, persistent   We discussed possible etiologies of chronic cough: allergies, residual for URI,GERD, asthma, COPD,chronic cough synd among some. She is on Topamax and Effexor, neither one has cough as a common side effect. Topamax could cause rhinitis and Effexor Flu like symptoms. CXR order placed, further recommendations will be given accordingly.  She agrees with trying treatment for some of above conditions. Instructed about warning signs. Instructed to let me know in 3-4 weeks if she is noticing improvement. We also discussed pulmonologist referral of problem is not any better or if symptoms get worse.    -     omeprazole (PRILOSEC) 20 MG capsule; Take 1 capsule (20 mg total) by mouth daily before breakfast. -     CBC with Differential/Platelet -     DG Chest 2 View; Future -     budesonide-formoterol (SYMBICORT) 160-4.5 MCG/ACT inhaler; Inhale 2 puffs into the lungs 2 (two) times daily. -     C-reactive protein  Allergic rhinitis, unspecified chronicity, unspecified seasonality, unspecified trigger  Could be contributing to cough.   Flonase nasal spray may hep with post nasal  drainage and therefore with cough.   -     fluticasone (FLONASE) 50 MCG/ACT nasal spray; Place 2 sprays into both nostrils daily.        Return in about 3 months (around 09/01/2016), or if symptoms worsen or fail to improve.     -Barbara Maldonado was advised to return or notify a doctor immediately if symptoms worsen or new concerns arise.       Kc Sedlak G. Martinique, MD  [facility]. [place] office.

## 2016-06-03 NOTE — Patient Instructions (Signed)
  [name] I have seen you today for an acute visit.  1. Cough, persistent  - omeprazole (PRILOSEC) 20 MG capsule; Take 1 capsule (20 mg total) by mouth daily before breakfast.  Dispense: 30 capsule; Refill: 1 - CBC with Differential/Platelet - DG Chest 2 View; Future - budesonide-formoterol (SYMBICORT) 160-4.5 MCG/ACT inhaler; Inhale 2 puffs into the lungs 2 (two) times daily.  Dispense: 1 Inhaler; Refill: 3  2. Allergic rhinitis, unspecified chronicity, unspecified seasonality, unspecified trigger  - fluticasone (FLONASE) 50 MCG/ACT nasal spray; Place 2 sprays into both nostrils daily.  Dispense: 16 g; Refill: 2   Cough can be caused by allergies, GERD, asthma, or unknown causes.  Today I am starting treatment for each possible cause, X ray order placed.    Avoid foods that make your symptoms worse, for example coffee, chocolate,pepermeint,alcohol, and greasy food. Raising the head of your bed about 6 inches may help with nocturnal symptoms.   Weight loss (if you are overweight). Avoid lying down for 3 hours after eating.  Instead 3 large meals daily try small and more frequent meals during the day.    You should be evaluated immediately if bloody vomiting, bloody stools, black stools (like tar), difficulty swallowing, food gets stuck on the way down or choking when eating. Abnormal weight loss or severe abdominal pain.    Pulmonologist referral can also be considered if not better.  Please let me know in about 3 weeks how is cough.   In general please monitor for signs of worsening symptoms and seek immediate medical attention if any concerning/warning symptom.

## 2016-06-04 ENCOUNTER — Encounter: Payer: Self-pay | Admitting: Family Medicine

## 2016-06-04 LAB — CBC WITH DIFFERENTIAL/PLATELET
BASOS ABS: 0 10*3/uL (ref 0.0–0.1)
Basophils Relative: 0.5 % (ref 0.0–3.0)
EOS PCT: 6 % — AB (ref 0.0–5.0)
Eosinophils Absolute: 0.4 10*3/uL (ref 0.0–0.7)
HCT: 38.8 % (ref 36.0–46.0)
HEMOGLOBIN: 13.2 g/dL (ref 12.0–15.0)
Lymphocytes Relative: 26.6 % (ref 12.0–46.0)
Lymphs Abs: 1.9 10*3/uL (ref 0.7–4.0)
MCHC: 34.2 g/dL (ref 30.0–36.0)
MCV: 89.3 fl (ref 78.0–100.0)
MONO ABS: 0.6 10*3/uL (ref 0.1–1.0)
MONOS PCT: 8.8 % (ref 3.0–12.0)
Neutro Abs: 4.1 10*3/uL (ref 1.4–7.7)
Neutrophils Relative %: 58.1 % (ref 43.0–77.0)
Platelets: 343 10*3/uL (ref 150.0–400.0)
RBC: 4.34 Mil/uL (ref 3.87–5.11)
RDW: 13.1 % (ref 11.5–15.5)
WBC: 7 10*3/uL (ref 4.0–10.5)

## 2016-06-04 LAB — C-REACTIVE PROTEIN: CRP: 0.9 mg/dL (ref 0.5–20.0)

## 2016-07-05 ENCOUNTER — Other Ambulatory Visit: Payer: Self-pay | Admitting: Neurology

## 2016-08-01 ENCOUNTER — Other Ambulatory Visit: Payer: Self-pay | Admitting: Family Medicine

## 2016-08-01 DIAGNOSIS — R053 Chronic cough: Secondary | ICD-10-CM

## 2016-08-01 DIAGNOSIS — R05 Cough: Secondary | ICD-10-CM

## 2016-08-05 ENCOUNTER — Ambulatory Visit (INDEPENDENT_AMBULATORY_CARE_PROVIDER_SITE_OTHER): Payer: 59 | Admitting: Neurology

## 2016-08-05 ENCOUNTER — Encounter: Payer: Self-pay | Admitting: Neurology

## 2016-08-05 VITALS — BP 106/60 | HR 100 | Ht 63.0 in | Wt 143.0 lb

## 2016-08-05 DIAGNOSIS — G43009 Migraine without aura, not intractable, without status migrainosus: Secondary | ICD-10-CM

## 2016-08-05 NOTE — Progress Notes (Signed)
NEUROLOGY FOLLOW UP OFFICE NOTE  KRISTA MIGHT [id number]  HISTORY OF PRESENT ILLNESS: Barbara Maldonado is a 51 year old right-handed female who follows up for migraine.   UPDATE: She is doing very well. Intensity:  6-7/10 Duration: brief with sumatriptan/naproxen Frequency:  Once a month Current NSAIDS:  naproxen 550mg  (with sumatriptan) Current analgesics:  no Current triptans:  sumatriptan 100mg  (with naproxen) Current anti-emetic:  no Current muscle relaxants:  no Current anti-anxiolytic:  no Current sleep aide:  no Current Antihypertensive medications:  no Current Antidepressant medications:  venlafaxine XR 75mg  Current Anticonvulsant medications:  topiramate 100mg  twice daily Current Vitamins/Herbal/Supplements:  no Current Antihistamines/Decongestants:  Zyrtec Other therapy:  no   Caffeine:  cut down Alcohol:  no Smoker:  no Diet:  Good.  Drinks a lot of water Exercise:  Usually but not lately Depression/stress:  controlled Sleep hygiene:  Good but feels fatigued still during day.   HISTORY: Onset:  childhood Location:  Left sided Quality:  Pounding, sharp Initial Intensity:  9-10/10; September: 6-7/10 Aura:  no Prodrome:  no Associated symptoms:  Nausea, photophobia, phonophobia, osmophobia, left ptosis.  Sometimes vomiting. Initial Duration:  1-2 days; September: brief with sumatriptan/naproxen Initial Frequency:  1 to 2 times a week.  However, she also has daily headache; September: Less than once a week Triggers/exacerbating factors:  none Relieving factors:  Hot towel on head, quiet Activity:  Cannot function 2 times a month   Past abortive therapy:  Excedrin, Maxalt, Relpax, Zomig po, Imitrex shot, Tylenol, ibuprofen Past preventative therapy:  zonismaide 50mg , nortriptyline (increased appetite)   Family history of headache:  mom  PAST MEDICAL HISTORY: Past Medical History:  Diagnosis Date  . Anxiety   . Depression   . Migraine   . PONV  (postoperative nausea and vomiting)     MEDICATIONS: Current Outpatient Prescriptions on File Prior to Visit  Medication Sig Dispense Refill  . budesonide-formoterol (SYMBICORT) 160-4.5 MCG/ACT inhaler Inhale 2 puffs into the lungs 2 (two) times daily. 1 Inhaler 3  . cetirizine (ZYRTEC) 10 MG tablet Take 10 mg by mouth daily as needed.     . fluticasone (FLONASE) 50 MCG/ACT nasal spray Place 2 sprays into both nostrils daily. 16 g 2  . omeprazole (PRILOSEC) 20 MG capsule TAKE 1 CAPSULE (20 MG TOTAL) BY MOUTH DAILY BEFORE BREAKFAST. 30 capsule 1  . SUMAtriptan-naproxen (TREXIMET) 85-500 MG tablet Take 1 tablet by mouth every 2 (two) hours as needed for migraine. Reported on 11/21/2015    . venlafaxine XR (EFFEXOR-XR) 75 MG 24 hr capsule TAKE 1 CAPSULE BY MOUTH DAILY. 90 capsule 1   No current facility-administered medications on file prior to visit.     ALLERGIES: No Known Allergies  FAMILY HISTORY: Family History  Problem Relation Age of Onset  . Multiple sclerosis Mother   . Migraines Mother   . Diabetes Mother   . Hypertension Mother   . Cancer Father     melanoma  . Hypertension Sister   . ADD / ADHD Son   . ADD / ADHD Daughter   . Cancer Paternal Grandmother     unknown    SOCIAL HISTORY: Social History   Social History  . Marital status: Married    Spouse name: N/A  . Number of children: N/A  . Years of education: N/A   Occupational History  . Not on file.   Social History Main Topics  . Smoking status: Never Smoker  . Smokeless tobacco: Never Used  .  Alcohol use No  . Drug use: No  . Sexual activity: Yes    Partners: Male   Other Topics Concern  . Not on file   Social History Narrative  . No narrative on file    REVIEW OF SYSTEMS: Constitutional: No fevers, chills, or sweats, no generalized fatigue, change in appetite Eyes: No visual changes, double vision, eye pain Ear, nose and throat: No hearing loss, ear pain, nasal congestion, sore  throat Cardiovascular: No chest pain, palpitations Respiratory:  No shortness of breath at rest or with exertion, wheezes GastrointestinaI: No nausea, vomiting, diarrhea, abdominal pain, fecal incontinence Genitourinary:  No dysuria, urinary retention or frequency Musculoskeletal:  No neck pain, back pain Integumentary: No rash, pruritus, skin lesions Neurological: as above Psychiatric: No depression, insomnia, anxiety Endocrine: No palpitations, fatigue, diaphoresis, mood swings, change in appetite, change in weight, increased thirst Hematologic/Lymphatic:  No purpura, petechiae. Allergic/Immunologic: no itchy/runny eyes, nasal congestion, recent allergic reactions, rashes  PHYSICAL EXAM: Vitals:   08/05/16 1113  BP: 106/60  Pulse: 100   General: No acute distress.  Patient appears well-groomed.  normal body habitus. Head:  Normocephalic/atraumatic Eyes:  Fundi examined but not visualized Neck: supple, no paraspinal tenderness, full range of motion Heart:  Regular rate and rhythm Lungs:  Clear to auscultation bilaterally Back: No paraspinal tenderness Neurological Exam: alert and oriented to person, place, and time. Attention span and concentration intact, recent and remote memory intact, fund of knowledge intact.  Speech fluent and not dysarthric, language intact.  CN II-XII intact. Bulk and tone normal, muscle strength 5/5 throughout.  Sensation to light touch  intact.  Deep tendon reflexes 2+ throughout.  Finger to nose testing intact.  Gait normal  IMPRESSION: Migraine  PLAN: 1.  Topiramate 100mg  twice daily and venlafaxine XR 75mg  daily for preventative therapy. 2.  Sumatriptan/naproxen for abortive therapy 3.  Follow up in 6 months.  Metta Clines, DO  CC:  Betty G. Martinique, MD

## 2016-08-05 NOTE — Patient Instructions (Addendum)
We won't make any changes today Follow up in 6 months.

## 2016-08-22 ENCOUNTER — Other Ambulatory Visit: Payer: Self-pay

## 2016-10-03 ENCOUNTER — Other Ambulatory Visit: Payer: Self-pay | Admitting: Physician Assistant

## 2016-10-13 ENCOUNTER — Other Ambulatory Visit: Payer: Self-pay | Admitting: Neurology

## 2017-01-15 ENCOUNTER — Ambulatory Visit: Payer: 59 | Admitting: Neurology

## 2017-02-03 ENCOUNTER — Ambulatory Visit: Payer: 59 | Admitting: Neurology

## 2017-02-19 ENCOUNTER — Encounter: Payer: Self-pay | Admitting: Neurology

## 2017-02-19 ENCOUNTER — Ambulatory Visit (INDEPENDENT_AMBULATORY_CARE_PROVIDER_SITE_OTHER): Payer: 59 | Admitting: Neurology

## 2017-02-19 VITALS — BP 126/74 | HR 74 | Ht 63.0 in | Wt 143.4 lb

## 2017-02-19 DIAGNOSIS — G43009 Migraine without aura, not intractable, without status migrainosus: Secondary | ICD-10-CM

## 2017-02-19 MED ORDER — TOPIRAMATE ER 50 MG PO CAP24: 50.0000 mg | ORAL_CAPSULE | Freq: Every day | ORAL | 0 refills | Status: DC

## 2017-02-19 MED ORDER — NAPROXEN SODIUM 550 MG PO TABS: ORAL_TABLET | ORAL | 5 refills | Status: DC

## 2017-02-19 MED ORDER — SUMATRIPTAN SUCCINATE 100 MG PO TABS: ORAL_TABLET | ORAL | 5 refills | Status: DC

## 2017-02-19 MED ORDER — TOPIRAMATE ER 100 MG PO CAP24: 100.0000 mg | ORAL_CAPSULE | Freq: Every day | ORAL | 2 refills | Status: DC

## 2017-02-19 NOTE — Patient Instructions (Signed)
1.  We will start extended release topiramate.  Take 50mg  at bedtime for 2 weeks (samples provided).  Then start 100mg  at bedtime.  Contact me prior to refill to determine if need to increase dose. 2.  Continue venlafaxine XR 75mg  daily  3.  Sumatriptan 100mg  with naproxen 550mg  at earliest onset of migraine.  May repeat once in 2 hours if needed (do not exceed 2 tablets in 24 hours).  Limit to no more than 2 days out of the week. 4.  Stop Goodys. 5.  Follow up in 6 months.

## 2017-02-19 NOTE — Progress Notes (Signed)
NEUROLOGY FOLLOW UP OFFICE NOTE  KASHANA BREACH [id number]  HISTORY OF PRESENT ILLNESS: Barbara Maldonado is a 51 year old right-handed female who follows up for migraine.   UPDATE: Due to problems with memory affecting her job, she tapered herself off of topiramate and has been off of it for about 6 to 8 weeks.  She has had an increase in frequency of headaches, almost daily.  She ran out of sumatriptan and naproxen, so she has been taking Goodys. Intensity:  6-7/10 Duration: brief with sumatriptan/naproxen but all day with Goodys Frequency:  Almost daily over past 2 weeks. Current NSAIDS:  naproxen 550mg  (with sumatriptan) Current analgesics:  Goodys Current triptans:  sumatriptan 100mg  (with naproxen) Current anti-emetic:  no Current muscle relaxants:  no Current anti-anxiolytic:  no Current sleep aide:  no Current Antihypertensive medications:  no Current Antidepressant medications:  venlafaxine XR 75mg  Current Anticonvulsant medications:  no Current Vitamins/Herbal/Supplements:  no Current Antihistamines/Decongestants:  Zyrtec Other therapy:  no   Caffeine:  cut down Alcohol:  no Smoker:  no Diet:  Good.  Drinks a lot of water Exercise:  Usually but not lately Depression/stress:  controlled Sleep hygiene:  Good but feels fatigued still during day.   HISTORY: Onset:  childhood Location:  Left sided Quality:  Pounding, sharp Initial Intensity:  9-10; February: 6-7/10 Aura:  no Prodrome:  no Associated symptoms:  Nausea, photophobia, phonophobia, osmophobia, left ptosis.  Sometimes vomiting. Initial Duration:  1-2 days; February: brief with sumatriptan/naproxen Initial Frequency:  1 to 2 times a week.  However, she also has daily headache; February: once a month Triggers/exacerbating factors:  none Relieving factors:  Hot towel on head, quiet Activity:  Cannot function 2 times a month   Past abortive therapy:  Excedrin, Maxalt, Relpax, Zomig po, Imitrex shot, Tylenol,  ibuprofen Past preventative therapy:  zonismaide 50mg , nortriptyline (increased appetite), topiramate 100mg  twice daily (memory problems but effective)   Family history of headache:  mom  PAST MEDICAL HISTORY: Past Medical History:  Diagnosis Date  . Anxiety   . Depression   . Migraine   . PONV (postoperative nausea and vomiting)     MEDICATIONS: Current Outpatient Prescriptions on File Prior to Visit  Medication Sig Dispense Refill  . cetirizine (ZYRTEC) 10 MG tablet Take 10 mg by mouth daily as needed.     Marland Kitchen omeprazole (PRILOSEC) 20 MG capsule TAKE 1 CAPSULE (20 MG TOTAL) BY MOUTH DAILY BEFORE BREAKFAST. 30 capsule 1  . promethazine (PHENERGAN) 25 MG tablet     . SUMAtriptan-naproxen (TREXIMET) 85-500 MG tablet Take 1 tablet by mouth every 2 (two) hours as needed for migraine. Reported on 11/21/2015    . venlafaxine XR (EFFEXOR-XR) 75 MG 24 hr capsule TAKE 1 CAPSULE BY MOUTH DAILY. 90 capsule 1   No current facility-administered medications on file prior to visit.     ALLERGIES: No Known Allergies  FAMILY HISTORY: Family History  Problem Relation Age of Onset  . Multiple sclerosis Mother   . Migraines Mother   . Diabetes Mother   . Hypertension Mother   . Cancer Father        melanoma  . Hypertension Sister   . ADD / ADHD Son   . ADD / ADHD Daughter   . Cancer Paternal Grandmother        unknown    SOCIAL HISTORY: Social History   Social History  . Marital status: Married    Spouse name: N/A  . Number of children:  N/A  . Years of education: N/A   Occupational History  . Not on file.   Social History Main Topics  . Smoking status: Never Smoker  . Smokeless tobacco: Never Used  . Alcohol use No  . Drug use: No  . Sexual activity: Yes    Partners: Male   Other Topics Concern  . Not on file   Social History Narrative  . No narrative on file    REVIEW OF SYSTEMS: Constitutional: No fevers, chills, or sweats, no generalized fatigue, change in  appetite Eyes: No visual changes, double vision, eye pain Ear, nose and throat: No hearing loss, ear pain, nasal congestion, sore throat Cardiovascular: No chest pain, palpitations Respiratory:  No shortness of breath at rest or with exertion, wheezes GastrointestinaI: No nausea, vomiting, diarrhea, abdominal pain, fecal incontinence Genitourinary:  No dysuria, urinary retention or frequency Musculoskeletal:  No neck pain, back pain Integumentary: No rash, pruritus, skin lesions Neurological: as above Psychiatric: No depression, insomnia, anxiety Endocrine: No palpitations, fatigue, diaphoresis, mood swings, change in appetite, change in weight, increased thirst Hematologic/Lymphatic:  No purpura, petechiae. Allergic/Immunologic: no itchy/runny eyes, nasal congestion, recent allergic reactions, rashes  PHYSICAL EXAM: Vitals:   02/19/17 0927  BP: 126/74  Pulse: 74  SpO2: 99%   General: No acute distress.  Patient appears well-groomed.  normal body habitus. Head:  Normocephalic/atraumatic Eyes:  Fundi examined but not visualized Neck: supple, no paraspinal tenderness, full range of motion Heart:  Regular rate and rhythm Lungs:  Clear to auscultation bilaterally Back: No paraspinal tenderness Neurological Exam: alert and oriented to person, place, and time. Attention span and concentration intact, recent and remote memory intact, fund of knowledge intact.  Speech fluent and not dysarthric, language intact.  CN II-XII intact. Bulk and tone normal, muscle strength 5/5 throughout.  Sensation to light touch, temperature and vibration intact.  Deep tendon reflexes 2+ throughout, toes downgoing.  Finger to nose and heel to shin testing intact.  Gait normal, Romberg negative.  IMPRESSION: Migraines  PLAN: 1.  We will start extended release topiramate, which less likely causes cognitive deficits.  Start 50mg  at bedtime for 2 weeks, then increase to 100mg  at bedtime.  We can increase dose as  needed in 6 weeks. 2.  Refill sumatriptan 100mg /naproxen 550mg . 3.  Stop Goodys 4.  Continue venlafaxine XR 75mg  daily 5.  Follow up in 6 months.  Barbara Clines, DO  CC:  Barbara Martinique, MD

## 2017-03-06 ENCOUNTER — Encounter: Payer: Self-pay | Admitting: Family Medicine

## 2017-04-10 ENCOUNTER — Telehealth: Payer: Self-pay | Admitting: Neurology

## 2017-04-10 NOTE — Telephone Encounter (Signed)
Patient called needing to get a refill on her Trokendi medication. She only has 1 left. She said [facility] has faxed over the refill request. She would like a 3 month Refill. Thanks

## 2017-04-11 ENCOUNTER — Other Ambulatory Visit: Payer: Self-pay

## 2017-04-11 MED ORDER — TOPIRAMATE ER 100 MG PO CAP24: 100.0000 mg | ORAL_CAPSULE | Freq: Every day | ORAL | 1 refills | Status: DC

## 2017-04-11 NOTE — Telephone Encounter (Signed)
Refill sent to [facility] in [facility]

## 2017-04-14 ENCOUNTER — Ambulatory Visit: Payer: 59 | Admitting: Neurology

## 2017-05-17 ENCOUNTER — Other Ambulatory Visit: Payer: Self-pay | Admitting: Neurology

## 2017-05-22 DIAGNOSIS — C4491 Basal cell carcinoma of skin, unspecified: Secondary | ICD-10-CM | POA: Insufficient documentation

## 2017-05-22 HISTORY — DX: Basal cell carcinoma of skin, unspecified: C44.91

## 2017-05-28 ENCOUNTER — Other Ambulatory Visit: Payer: Self-pay | Admitting: Obstetrics and Gynecology

## 2017-05-28 DIAGNOSIS — R928 Other abnormal and inconclusive findings on diagnostic imaging of breast: Secondary | ICD-10-CM

## 2017-06-02 ENCOUNTER — Ambulatory Visit: Payer: 59

## 2017-06-02 ENCOUNTER — Ambulatory Visit
Admission: RE | Admit: 2017-06-02 | Discharge: 2017-06-02 | Disposition: A | Discharge status: Home / Self Care | Payer: 59 | Source: Ambulatory Visit | Attending: Obstetrics and Gynecology | Admitting: Obstetrics and Gynecology

## 2017-06-02 DIAGNOSIS — R928 Other abnormal and inconclusive findings on diagnostic imaging of breast: Secondary | ICD-10-CM

## 2017-08-11 ENCOUNTER — Other Ambulatory Visit: Payer: Self-pay | Admitting: Neurology

## 2017-08-11 MED ORDER — VENLAFAXINE HCL ER 75 MG PO CP24: 75.0000 mg | ORAL_CAPSULE | Freq: Every day | ORAL | 0 refills | Status: DC

## 2017-08-19 ENCOUNTER — Ambulatory Visit: Payer: 59 | Admitting: Neurology

## 2017-09-30 ENCOUNTER — Encounter: Payer: Self-pay | Admitting: Neurology

## 2017-09-30 ENCOUNTER — Ambulatory Visit: Payer: 59 | Admitting: Neurology

## 2017-09-30 VITALS — BP 118/80 | HR 79 | Ht 63.0 in | Wt 147.2 lb

## 2017-09-30 DIAGNOSIS — G43009 Migraine without aura, not intractable, without status migrainosus: Secondary | ICD-10-CM | POA: Diagnosis not present

## 2017-09-30 NOTE — Progress Notes (Signed)
NEUROLOGY FOLLOW UP OFFICE NOTE  Barbara Maldonado [id number]  HISTORY OF PRESENT ILLNESS: Barbara Maldonado is a 52 year old right-handed female who follows up for migraine.   UPDATE: Intensity:  6-7/10 Duration: brief with sumatriptan/naproxen Frequency:  Usually 2 days a month (increased frequency with change in weather) Current NSAIDS:  naproxen 550mg  (with sumatriptan) Current analgesics:  no Current triptans:  sumatriptan 100mg  (with naproxen) Current anti-emetic:  no Current muscle relaxants:  no Current anti-anxiolytic:  no Current sleep aide:  no Current Antihypertensive medications:  no Current Antidepressant medications:  venlafaxine XR 75mg  Current Anticonvulsant medications:  topiramate ER 100mg  Current Vitamins/Herbal/Supplements:  no Current Antihistamines/Decongestants:  Zyrtec Other therapy:  no   Caffeine:  cut down Alcohol:  no Smoker:  no Diet:  Good.  Drinks a lot of water Exercise:  yes Depression/anxiety:  No Sleep hygiene:  Good   HISTORY: Onset:  childhood Location:  Left sided Quality:  Pounding, sharp Initial Intensity:  9-10; February: 6-7/10 Aura:  no Prodrome:  no Associated symptoms:  Nausea, photophobia, phonophobia, osmophobia, left ptosis.  Sometimes vomiting.  No unilateral numbness or weakness. Initial Duration:  1-2 days; February: brief with sumatriptan/naproxen Initial Frequency:  1 to 2 times a week.  However, she also has daily headache; February: once a month Triggers/exacerbating factors:  none Relieving factors:  Hot towel on head, quiet Activity:  Cannot function 2 times a month   Past abortive therapy:  Excedrin, Maxalt, Relpax, Zomig po, Imitrex shot, Tylenol, ibuprofen, Goody Past preventative therapy:  zonismaide 50mg , nortriptyline (increased appetite), topiramate 100mg  twice daily (memory problems but effective)   Family history of headache:  mom  PAST MEDICAL HISTORY: Past Medical History:  Diagnosis Date  .  Anxiety   . Depression   . Migraine   . PONV (postoperative nausea and vomiting)     MEDICATIONS: Current Outpatient Medications on File Prior to Visit  Medication Sig Dispense Refill  . cetirizine (ZYRTEC) 10 MG tablet Take 10 mg by mouth daily as needed.     . naproxen sodium (ANAPROX) 550 MG tablet Take 1 tablet with each dose of sumatriptan 10 tablet 5  . SUMAtriptan (IMITREX) 100 MG tablet Take 1 tab at earliest onset of headache.  May repeat in 2 hours if headache persists or recurs.  Do not exceed 2 tablets in 24 hours 10 tablet 5  . SUMAtriptan-naproxen (TREXIMET) 85-500 MG tablet Take 1 tablet by mouth every 2 (two) hours as needed for migraine. Reported on 11/21/2015    . Topiramate ER (TROKENDI XR) 100 MG CP24 Take 100 mg by mouth daily. (Patient not taking: Reported on 09/30/2017) 30 capsule 2  . Topiramate ER (TROKENDI XR) 100 MG CP24 Take 100 mg by mouth at bedtime. (Patient not taking: Reported on 09/30/2017) 90 capsule 1  . Topiramate ER (TROKENDI XR) 50 MG CP24 Take 50 mg by mouth at bedtime. 14 capsule 0  . venlafaxine XR (EFFEXOR-XR) 75 MG 24 hr capsule Take 1 capsule (75 mg total) by mouth daily. 90 capsule 0   No current facility-administered medications on file prior to visit.     ALLERGIES: No Known Allergies  FAMILY HISTORY: Family History  Problem Relation Age of Onset  . Multiple sclerosis Mother   . Migraines Mother   . Diabetes Mother   . Hypertension Mother   . Cancer Father        melanoma  . Hypertension Sister   . ADD / ADHD Son   .  ADD / ADHD Daughter   . Cancer Paternal Grandmother        unknown    SOCIAL HISTORY: Social History   Socioeconomic History  . Marital status: Married    Spouse name: Not on file  . Number of children: Not on file  . Years of education: Not on file  . Highest education level: Not on file  Occupational History  . Not on file  Social Needs  . Financial resource strain: Not on file  . Food insecurity:     Worry: Not on file    Inability: Not on file  . Transportation needs:    Medical: Not on file    Non-medical: Not on file  Tobacco Use  . Smoking status: Never Smoker  . Smokeless tobacco: Never Used  Substance and Sexual Activity  . Alcohol use: No    Alcohol/week: 0.0 oz  . Drug use: No  . Sexual activity: Yes    Partners: Male  Lifestyle  . Physical activity:    Days per week: Not on file    Minutes per session: Not on file  . Stress: Not on file  Relationships  . Social connections:    Talks on phone: Not on file    Gets together: Not on file    Attends religious service: Not on file    Active member of club or organization: Not on file    Attends meetings of clubs or organizations: Not on file    Relationship status: Not on file  . Intimate partner violence:    Fear of current or ex partner: Not on file    Emotionally abused: Not on file    Physically abused: Not on file    Forced sexual activity: Not on file  Other Topics Concern  . Not on file  Social History Narrative  . Not on file    REVIEW OF SYSTEMS: Constitutional: No fevers, chills, or sweats, no generalized fatigue, change in appetite Eyes: No visual changes, double vision, eye pain Ear, nose and throat: No hearing loss, ear pain, nasal congestion, sore throat Cardiovascular: No chest pain, palpitations Respiratory:  No shortness of breath at rest or with exertion, wheezes GastrointestinaI: No nausea, vomiting, diarrhea, abdominal pain, fecal incontinence Genitourinary:  No dysuria, urinary retention or frequency Musculoskeletal:  No neck pain, back pain Integumentary: No rash, pruritus, skin lesions Neurological: as above Psychiatric: No depression, insomnia, anxiety Endocrine: No palpitations, fatigue, diaphoresis, mood swings, change in appetite, change in weight, increased thirst Hematologic/Lymphatic:  No purpura, petechiae. Allergic/Immunologic: no itchy/runny eyes, nasal congestion, recent  allergic reactions, rashes  PHYSICAL EXAM: Vitals:   09/30/17 1332  BP: 118/80  Pulse: 79  SpO2: 98%   General: No acute distress.  Patient appears well-groomed.   Head:  Normocephalic/atraumatic Eyes:  Fundi examined but not visualized Neck: supple, no paraspinal tenderness, full range of motion Heart:  Regular rate and rhythm Lungs:  Clear to auscultation bilaterally Back: No paraspinal tenderness Neurological Exam: alert and oriented to person, place, and time. Attention span and concentration intact, recent and remote memory intact, fund of knowledge intact.  Speech fluent and not dysarthric, language intact.  CN II-XII intact. Bulk and tone normal, muscle strength 5/5 throughout.  Sensation to light touch  intact.  Deep tendon reflexes 2+ throughout.  Finger to nose testing intact.  Gait normal, Romberg negative.  IMPRESSION: Migraine without aura  PLAN: 1.  Continue topiramate ER 100mg  at bedtime 2.  Take sumatriptan with naproxen  at earliest onset of migraine.  May repeat dose once after 2 hours if needed, not to exceed 2 doses in 24 hours.  Limit use to no more than 2 days out of week to prevent rebound headache 3.  Keep headache diary 4.  Continue exercise and hydration 5.  Follow up in 9 months.  Metta Clines, DO  CC: Betty Martinique, MD

## 2017-09-30 NOTE — Patient Instructions (Signed)
1.  Continue topiramate ER 100mg  at bedtime 2.  Take sumatriptan with naproxen at earliest onset of migraine.  May repeat dose once after 2 hours if needed, not to exceed 2 doses in 24 hours.  Limit use to no more than 2 days out of week to prevent rebound headache 3.  Keep headache diary 4.  Continue exercise and hydration 5.  Follow up in 9 months.

## 2017-10-14 ENCOUNTER — Encounter: Payer: Self-pay | Admitting: Neurology

## 2017-11-27 ENCOUNTER — Ambulatory Visit: Payer: 59 | Admitting: Neurology

## 2017-12-02 ENCOUNTER — Ambulatory Visit: Payer: 59 | Admitting: Neurology

## 2017-12-02 ENCOUNTER — Encounter

## 2018-01-12 ENCOUNTER — Other Ambulatory Visit: Payer: Self-pay | Admitting: Neurology

## 2018-04-19 ENCOUNTER — Other Ambulatory Visit: Payer: Self-pay | Admitting: Neurology

## 2018-07-02 ENCOUNTER — Ambulatory Visit: Payer: 59 | Admitting: Neurology

## 2018-07-18 ENCOUNTER — Other Ambulatory Visit: Payer: Self-pay | Admitting: Neurology

## 2018-07-29 NOTE — Progress Notes (Signed)
NEUROLOGY FOLLOW UP OFFICE NOTE  Barbara Maldonado [id number]  HISTORY OF PRESENT ILLNESS: Barbara Maldonado is a 53 year old right-handed woman who follows up for migraine.  UPDATE: In September, she had spinning spells and was diagnosed with a viral illness (vestibular neuritis?) and prescribed meclizine.  Symptoms improved after a couple of weeks.  On Saturday, she was at a birthday party and jumped on the trampalene.  She felt fine.  Next morning she woke up with spinning with nausea and vomiting.  She went to Urgent Care.  It is positional and is aggravated by standing up, sudden movement, turning over or even stretching in bed.  Laying still, it will last 5 to 15 seconds.  They have occurred throughout this past week but not daily.  Headaches have increased as well, likely still due to the weather.    Intensity: 6-7/10 Duration: Brief with sumatriptan and naproxen Frequency: 1 to 2 days a week. Current NSAIDS:  naproxen 550mg  (with sumatriptan) Current analgesics:  no Current triptans:  sumatriptan 100mg  (with naproxen) Current anti-emetic:  no Current muscle relaxants:  no Current anti-anxiolytic:  no Current sleep aide:  no Current Antihypertensive medications:  no Current Antidepressant medications:  venlafaxine XR 75mg  Current Anticonvulsant medications:  topiramate ER 100mg  Current Vitamins/Herbal/Supplements:  no Current Antihistamines/Decongestants:  Zyrtec Other therapy:  no  Caffeine: cut down Alcohol: no Smoker: no Diet: Good. Drinks a lot of water Exercise: yes Depression/anxiety:  No Sleep hygiene: Good  HISTORY: Onset: Childhood Location: Left sided Quality: Pounding, sharp Initial Intensity: 9-10; February: 6-7/10 Aura: no Prodrome: no Associated symptoms: Nausea, photophobia, phonophobia, osmophobia, left ptosis. Sometimes vomiting.  No unilateral numbness or weakness. Initial Duration: 1-2 days; February: brief with  sumatriptan/naproxen Initial Frequency: 1 to 2 times a week. However, she also has daily headache; February: once a month Triggers: None Relieving factors: Hot towel on head, quiet Activity: Cannot function 2 times a month  Past abortive therapy: Excedrin, Maxalt, Relpax, Zomig po, Imitrex shot, Tylenol, ibuprofen, Goody Past preventative therapy: zonismaide 50mg , nortriptyline (increased appetite), topiramate 100mg  twice daily (memory problems but effective)  Family history of headache: mom  PAST MEDICAL HISTORY: Past Medical History:  Diagnosis Date  . Anxiety   . Depression   . Migraine   . PONV (postoperative nausea and vomiting)     MEDICATIONS: Current Outpatient Medications on File Prior to Visit  Medication Sig Dispense Refill  . cetirizine (ZYRTEC) 10 MG tablet Take 10 mg by mouth daily as needed.     . naproxen sodium (ANAPROX) 550 MG tablet TAKE 1 TABLET BY MOUTH WITH EACH DOSE OF SUMATRIPTAN 10 tablet 5  . SUMAtriptan (IMITREX) 100 MG tablet TAKE 1 TABLET BY MOUTH AT EARLIEST ONSET OF HEADACHE. MAY REPEAT IN 2 HOURS IF NEEDED. DO NOT EXCEED 10 tablet 5  . SUMAtriptan-naproxen (TREXIMET) 85-500 MG tablet Take 1 tablet by mouth every 2 (two) hours as needed for migraine. Reported on 11/21/2015    . Topiramate ER (TROKENDI XR) 100 MG CP24 Take 100 mg by mouth daily. (Patient not taking: Reported on 09/30/2017) 30 capsule 2  . Topiramate ER (TROKENDI XR) 50 MG CP24 Take 50 mg by mouth at bedtime. 14 capsule 0  . TROKENDI XR 100 MG CP24 TAKE 1 CAPSULE BY MOUTH EVERYDAY AT BEDTIME 90 capsule 1  . venlafaxine XR (EFFEXOR-XR) 75 MG 24 hr capsule TAKE 1 CAPSULE BY MOUTH EVERY DAY 90 capsule 1   No current facility-administered medications on file prior to visit.  ALLERGIES: No Known Allergies  FAMILY HISTORY: Family History  Problem Relation Age of Onset  . Multiple sclerosis Mother   . Migraines Mother   . Diabetes Mother   . Hypertension Mother   . Cancer  Father        melanoma  . Hypertension Sister   . ADD / ADHD Son   . ADD / ADHD Daughter   . Cancer Paternal Grandmother        unknown   SOCIAL HISTORY: Social History   Socioeconomic History  . Marital status: Married    Spouse name: Not on file  . Number of children: Not on file  . Years of education: Not on file  . Highest education level: Not on file  Occupational History  . Not on file  Social Needs  . Financial resource strain: Not on file  . Food insecurity:    Worry: Not on file    Inability: Not on file  . Transportation needs:    Medical: Not on file    Non-medical: Not on file  Tobacco Use  . Smoking status: Never Smoker  . Smokeless tobacco: Never Used  Substance and Sexual Activity  . Alcohol use: No    Alcohol/week: 0.0 standard drinks  . Drug use: No  . Sexual activity: Yes    Partners: Male  Lifestyle  . Physical activity:    Days per week: Not on file    Minutes per session: Not on file  . Stress: Not on file  Relationships  . Social connections:    Talks on phone: Not on file    Gets together: Not on file    Attends religious service: Not on file    Active member of club or organization: Not on file    Attends meetings of clubs or organizations: Not on file    Relationship status: Not on file  . Intimate partner violence:    Fear of current or ex partner: Not on file    Emotionally abused: Not on file    Physically abused: Not on file    Forced sexual activity: Not on file  Other Topics Concern  . Not on file  Social History Narrative  . Not on file    REVIEW OF SYSTEMS: Constitutional: No fevers, chills, or sweats, no generalized fatigue, change in appetite Eyes: No visual changes, double vision, eye pain Ear, nose and throat: No hearing loss, ear pain, nasal congestion, sore throat Cardiovascular: No chest pain, palpitations Respiratory:  No shortness of breath at rest or with exertion, wheezes GastrointestinaI: No nausea,  vomiting, diarrhea, abdominal pain, fecal incontinence Genitourinary:  No dysuria, urinary retention or frequency Musculoskeletal:  No neck pain, back pain Integumentary: No rash, pruritus, skin lesions Neurological: as above Psychiatric: No depression, insomnia, anxiety Endocrine: No palpitations, fatigue, diaphoresis, mood swings, change in appetite, change in weight, increased thirst Hematologic/Lymphatic:  No purpura, petechiae. Allergic/Immunologic: no itchy/runny eyes, nasal congestion, recent allergic reactions, rashes  PHYSICAL EXAM: Blood pressure 96/78, pulse 75, height 5\' 3"  (1.6 m), weight 145 lb (65.8 kg), SpO2 99 %. General: No acute distress.  Patient appears well-groomed. Head:  Normocephalic/atraumatic Eyes:  Fundi examined but not visualized Neck: supple, no paraspinal tenderness, full range of motion Heart:  Regular rate and rhythm Lungs:  Clear to auscultation bilaterally Back: No paraspinal tenderness Neurological Exam: alert and oriented to person, place, and time. Attention span and concentration intact, recent and remote memory intact, fund of knowledge intact.  Speech fluent  and not dysarthric, language intact.  CN II-XII intact. Bulk and tone normal, muscle strength 5/5 throughout.  Sensation to light touch, temperature and vibration intact.  Deep tendon reflexes 2+ throughout, toes downgoing.  Finger to nose and heel to shin testing intact.  Gait normal, Romberg negative.  IMPRESSION: 1.  Migraine without aura, without status migrainosus, not intractable 2.  Benign paroxysmal positional vertigo  PLAN: 1.  For preventative management, we will start Aimovig 70mg  monthly.  She will continue topiramate ER for now.  If migraines improved, then consider tapering off of topiramate. 2.  For abortive therapy, sumatriptan with naproxen 3.  Limit use of pain relievers to no more than 2 days out of week to prevent risk of rebound or medication-overuse headache. 4.  Keep  headache diary 5.  Exercise, hydration, caffeine cessation, sleep hygiene, monitor for and avoid triggers 6.  Consider:  magnesium citrate 400mg  daily, riboflavin 400mg  daily, and coenzyme Q10 100mg  three times daily 7.  If vertigo persists, she will contact me and I will refer her to vestibular rehabilitation. 8. Follow up in 4 months.  Barbara Clines, DO  CC: Betty Martinique, MD

## 2018-07-31 ENCOUNTER — Encounter: Payer: Self-pay | Admitting: Neurology

## 2018-07-31 ENCOUNTER — Ambulatory Visit: Payer: 59 | Admitting: Neurology

## 2018-07-31 VITALS — BP 96/78 | HR 75 | Ht 63.0 in | Wt 145.0 lb

## 2018-07-31 DIAGNOSIS — G43009 Migraine without aura, not intractable, without status migrainosus: Secondary | ICD-10-CM | POA: Diagnosis not present

## 2018-07-31 DIAGNOSIS — H8110 Benign paroxysmal vertigo, unspecified ear: Secondary | ICD-10-CM | POA: Diagnosis not present

## 2018-07-31 MED ORDER — ERENUMAB-AOOE 70 MG/ML ~~LOC~~ SOAJ: 70.0000 mg | SUBCUTANEOUS | 11 refills | Status: DC

## 2018-07-31 MED ORDER — ERENUMAB-AOOE 70 MG/ML ~~LOC~~ SOAJ: 70.0000 mg | Freq: Once | SUBCUTANEOUS | 0 refills | Status: AC

## 2018-07-31 NOTE — Patient Instructions (Signed)
1.  For preventative management, we will start Aimovig 70mg  monthly.  Continue Trokendi XR 100mg  daily for now.  If you are doing well, then next time we can consider stopping Trokendi 2.  For abortive therapy, sumatriptan with naproxen 3.  Limit use of pain relievers to no more than 2 days out of week to prevent risk of rebound or medication-overuse headache. 4.  Keep headache diary 5.  Exercise, hydration, caffeine cessation, sleep hygiene, monitor for and avoid triggers 6.  Consider:  magnesium citrate 400mg  daily, riboflavin 400mg  daily, and coenzyme Q10 100mg  three times daily 7. If dizziness does not improve, contact me and I can refer you to physical therapy for vestibular rehabilitation.  8. Follow up in 4 months.

## 2018-08-07 NOTE — Progress Notes (Signed)
Received notice that pt's Aimovig has been   Approved 02.21.2020 - 05.21.2020

## 2018-10-21 ENCOUNTER — Other Ambulatory Visit: Payer: Self-pay | Admitting: Neurology

## 2018-11-20 NOTE — Progress Notes (Signed)
Initiated on Cover My Meds Key: [id number]  Your information has been submitted to [place]. To check for an updated outcome later, reopen this PA request from your dashboard. If [employer] has not responded to your request within 24 hours, contact [place] at [phone]

## 2018-11-23 NOTE — Progress Notes (Signed)
Aimovig has been approved. Will receive fax with specific dates.  [place], spoke with [place], she ran the claim while on the phone with me and it was approved and went through.

## 2018-11-24 ENCOUNTER — Telehealth: Payer: Self-pay | Admitting: Neurology

## 2018-11-24 NOTE — Telephone Encounter (Signed)
Cover my meds is calling on a PA for this patient with the Aimovig medication.  Please call back at [phone] and Ref #: [id number]. Thanks!

## 2018-11-24 NOTE — Telephone Encounter (Signed)
Called Cover My Meds. Spoke with [place]. I advised her this was already approved on 11/20/18. Gave her Key: [id number] for her to look up. She apologized, said it had not been updated.

## 2018-12-02 DIAGNOSIS — N809 Endometriosis, unspecified: Secondary | ICD-10-CM | POA: Insufficient documentation

## 2018-12-03 NOTE — Progress Notes (Signed)
Virtual Visit via Video Note The purpose of this virtual visit is to provide medical care while limiting exposure to the novel coronavirus.    Consent was obtained for video visit:  Yes.   Answered questions that patient had about telehealth interaction:  Yes.   I discussed the limitations, risks, security and privacy concerns of performing an evaluation and management service by telemedicine. I also discussed with the patient that there may be a patient responsible charge related to this service. The patient expressed understanding and agreed to proceed.  Pt location: Home Physician Location: office Name of referring provider:  Martinique, Betty G, MD I connected with Darlin Coco at patients initiation/request on 12/04/2018 at 10:50 AM EDT by video enabled telemedicine application and verified that I am speaking with the correct person using two identifiers. Pt MRN:  [id number] Pt DOB:  August 01, 1965 Video Participants:  Darlin Coco;    History of Present Illness:  Barbara Maldonado is a 53 year old right-handed woman who follows up for migraines.  UPDATE:   Increased headaches past 2 months.  Not sure why, possibly weather and/or increased stress.  Neck and shoulders are more stiff and she has been clenching her teeth. Intensity: 6-7/10 Duration: Brief with sumatriptan and naproxen.  On a couple of occasions, needed to take second dose (2-3 hours) Frequency: 3 to 5 days a week. Current NSAIDS: naproxen 550mg  (with sumatriptan) Current analgesics: no Current triptans: sumatriptan 100mg  (with naproxen) Current anti-emetic: no Current muscle relaxants: no Current anti-anxiolytic: no Current sleep aide: no Current Antihypertensive medications: no Current Antidepressant medications: venlafaxine XR 75mg  Current Anticonvulsant medications: topiramate ER 100mg  Current anti-CGRP:  Aimovig 70mg  Current Vitamins/Herbal/Supplements: no Current Antihistamines/Decongestants: Zyrtec  Other therapy: no  Caffeine:cut down Alcohol:no Smoker:no Diet:Good.Drinks a lot of water Exercise:yes Depression/anxiety: No Sleep hygiene:Good  HISTORY: Onset:  Childhood Location:Left sided Quality:Pounding, sharp Initial Intensity:9-10; February: 6-7/10 Aura:no Prodrome:no Associated symptoms:  Nausea, photophobia, phonophobia, osmophobia, left ptosis.Sometimes vomiting. No unilateral numbness or weakness. Initial Duration:1-2 days; February: brief with sumatriptan/naproxen Initial Frequency:1 to 2 times a week.However, she also has daily headache; February: once a month Triggers: None Relieving factors:  Hot towel on head, quiet Activity:Cannot function 2 times a month  Past abortive therapy:Excedrin, Maxalt, Relpax, Zomig po, Imitrex shot, Tylenol, ibuprofen, Goody Past preventative therapy:zonisamide 50mg , nortriptyline (increased appetite), topiramate 100mg  twice daily (memory problems but effective)  Family history of headache:mom  Past Medical History: Past Medical History:  Diagnosis Date  . Anxiety   . Basal cell carcinoma of skin 05/22/2017  . Depression   . Migraine   . PONV (postoperative nausea and vomiting)     Medications: Outpatient Encounter Medications as of 12/04/2018  Medication Sig  . cetirizine (ZYRTEC) 10 MG tablet Take 10 mg by mouth daily as needed.   Eduard Roux (AIMOVIG) 70 MG/ML SOAJ Inject 70 mg into the skin every 30 (thirty) days.  . SUMAtriptan-naproxen (TREXIMET) 85-500 MG tablet Take 1 tablet by mouth every 2 (two) hours as needed for migraine. Reported on 11/21/2015  . TROKENDI XR 100 MG CP24 TAKE 1 CAPSULE BY MOUTH EVERYDAY AT BEDTIME  . venlafaxine XR (EFFEXOR-XR) 75 MG 24 hr capsule TAKE 1 CAPSULE BY MOUTH EVERY DAY  . [DISCONTINUED] naproxen sodium (ANAPROX) 550 MG tablet TAKE 1 TABLET BY MOUTH WITH EACH DOSE OF SUMATRIPTAN  . [DISCONTINUED] SUMAtriptan (IMITREX) 100 MG tablet  TAKE 1 TABLET BY MOUTH AT EARLIEST ONSET OF HEADACHE. MAY REPEAT IN 2 HOURS IF NEEDED. DO NOT EXCEED  . [  DISCONTINUED] Topiramate ER (TROKENDI XR) 100 MG CP24 Take 100 mg by mouth daily. (Patient not taking: Reported on 09/30/2017)   No facility-administered encounter medications on file as of 12/04/2018.     Allergies: No Known Allergies  Family History: Family History  Problem Relation Age of Onset  . Multiple sclerosis Mother   . Migraines Mother   . Diabetes Mother   . Hypertension Mother   . Cancer Father        melanoma  . Hypertension Sister   . ADD / ADHD Son   . ADD / ADHD Daughter   . Cancer Paternal Grandmother        unknown    Social History: Social History   Socioeconomic History  . Marital status: Married    Spouse name: Not on file  . Number of children: Not on file  . Years of education: Not on file  . Highest education level: Not on file  Occupational History  . Not on file  Social Needs  . Financial resource strain: Not on file  . Food insecurity    Worry: Not on file    Inability: Not on file  . Transportation needs    Medical: Not on file    Non-medical: Not on file  Tobacco Use  . Smoking status: Never Smoker  . Smokeless tobacco: Never Used  Substance and Sexual Activity  . Alcohol use: No    Alcohol/week: 0.0 standard drinks  . Drug use: No  . Sexual activity: Yes    Partners: Male  Lifestyle  . Physical activity    Days per week: Not on file    Minutes per session: Not on file  . Stress: Not on file  Relationships  . Social Herbalist on phone: Not on file    Gets together: Not on file    Attends religious service: Not on file    Active member of club or organization: Not on file    Attends meetings of clubs or organizations: Not on file    Relationship status: Not on file  . Intimate partner violence    Fear of current or ex partner: Not on file    Emotionally abused: Not on file    Physically abused: Not on file     Forced sexual activity: Not on file  Other Topics Concern  . Not on file  Social History Narrative   Right handed   Lives in a single story home with granddaughter and husband.   Observations/Objective:   Vitals:   12/02/18 1033  Weight: 144 lb (65.3 kg)  Height: 5\' 3"  (1.6 m)  no acute distress.  Alert and oriented.  Speech fluent and not dysarthric.  Language intact.  Face symmetric.    Assessment and Plan:   Migraine without aura, without status migrainosus, not intractable.  Increased frequency, possibly due to weather or emotional stress  1.  For preventative management, increase Aimovig to 140mg  and continue topiramate ER 100mg  daily and venlafaxine XR 75mg  daily 2.  For abortive therapy, sumatriptan with naproxen 3.  Limit use of pain relievers to no more than 2 days out of week to prevent risk of rebound or medication-overuse headache. 4.  Keep headache diary 5.  Exercise, hydration, caffeine cessation, sleep hygiene, monitor for and avoid triggers 6.  Consider:  magnesium citrate 400mg  daily, riboflavin 400mg  daily, and coenzyme Q10 100mg  three times daily 7. Always keep in mind that currently taking a hormone or birth  control may be a possible trigger or aggravating factor for migraine. 8. Follow up in 4 months   Follow Up Instructions:    -I discussed the assessment and treatment plan with the patient. The patient was provided an opportunity to ask questions and all were answered. The patient agreed with the plan and demonstrated an understanding of the instructions.   The patient was advised to call back or seek an in-person evaluation if the symptoms worsen or if the condition fails to improve as anticipated.   Dudley Major, DO

## 2018-12-04 ENCOUNTER — Telehealth (INDEPENDENT_AMBULATORY_CARE_PROVIDER_SITE_OTHER): Payer: 59 | Admitting: Neurology

## 2018-12-04 ENCOUNTER — Other Ambulatory Visit: Payer: Self-pay

## 2018-12-04 ENCOUNTER — Encounter: Payer: Self-pay | Admitting: Neurology

## 2018-12-04 VITALS — Ht 63.0 in | Wt 144.0 lb

## 2018-12-04 DIAGNOSIS — G43009 Migraine without aura, not intractable, without status migrainosus: Secondary | ICD-10-CM

## 2018-12-04 MED ORDER — AIMOVIG 140 MG/ML ~~LOC~~ SOAJ: 140.0000 mg | SUBCUTANEOUS | 5 refills | Status: DC

## 2018-12-17 ENCOUNTER — Telehealth: Payer: Self-pay

## 2018-12-17 NOTE — Telephone Encounter (Signed)
Pt called no answer voice mail left saying Aimovig was approved, any questions to call the office back.

## 2018-12-17 NOTE — Progress Notes (Signed)
PA for Aimovig 140mg /ml approved 12/17/2018 until 12/17/2019  [employer] [employer]  Member ID [id number]  Pt called no answer voice mail left saying Aimovig was approved, any questions to call the office back.

## 2018-12-17 NOTE — Progress Notes (Signed)
PA started in Cover My Meds  For Amovig 140mg /ml   Key; [id number]  Waiting for [employer] to send more questions

## 2019-01-14 ENCOUNTER — Telehealth (INDEPENDENT_AMBULATORY_CARE_PROVIDER_SITE_OTHER): Payer: 59 | Admitting: Family Medicine

## 2019-01-14 ENCOUNTER — Other Ambulatory Visit: Payer: Self-pay

## 2019-01-14 ENCOUNTER — Encounter: Payer: Self-pay | Admitting: Family Medicine

## 2019-01-14 DIAGNOSIS — R1013 Epigastric pain: Secondary | ICD-10-CM | POA: Diagnosis not present

## 2019-01-14 DIAGNOSIS — R5383 Other fatigue: Secondary | ICD-10-CM | POA: Diagnosis not present

## 2019-01-14 DIAGNOSIS — R112 Nausea with vomiting, unspecified: Secondary | ICD-10-CM | POA: Diagnosis not present

## 2019-01-14 NOTE — Progress Notes (Signed)
Virtual Visit via Video Note  I connected with Barbara Maldonado  on 01/14/19 at  6:20 PM EDT by a video enabled telemedicine application and verified that I am speaking with the correct person using two identifiers.  Location patient: home Location provider:work or home office Persons participating in the virtual visit: patient, provider  I discussed the limitations of evaluation and management by telemedicine and the availability of in person appointments. The patient expressed understanding and agreed to proceed.   HPI:  Acute visit for  -symptoms started about 5-6 days ago -symptoms include:  nausea, fatigue, some epigastric abd pain worse after meals, some chills initially, some L side pain vomiting initially many times per day for 2 days - went to urgent care 5 days ago and they gave her an antiemetic which did not help and a dx of gastroenteritis -no vomiting the last few days but continued nausea and poor appetite -had some dysuria initially but had culture and urine studies at urgent care per her report and those were negative and this resolved -she had some blood on TP after a BM once, she has had some harder stools, normal BM today - no blood in the stool or in the toilet -no fevers, resp symptoms, melena, bloody emesis, coffe ground emesis, recent abx, diarrhea, recent travel, sick contacts -no sick contact, does telework, she does take care of her granddaughter whom is in daycare  ROS: See pertinent positives and negatives per HPI.  Past Medical History:  Diagnosis Date  . Anxiety   . Basal cell carcinoma of skin 05/22/2017  . Depression   . Migraine   . PONV (postoperative nausea and vomiting)     Past Surgical History:  Procedure Laterality Date  . ABLATION    . BILATERAL SALPINGECTOMY Bilateral 06/08/2013   Procedure: BILATERAL SALPINGECTOMY;  Surgeon: Olga Millers, MD;  Location: [facility];  Service: Gynecology;  Laterality: Bilateral;  . BLADDER REPAIR    . LAPAROSCOPIC  ASSISTED VAGINAL HYSTERECTOMY N/A 06/08/2013   Procedure: LAPAROSCOPIC ASSISTED VAGINAL HYSTERECTOMY;  Surgeon: Olga Millers, MD;  Location: [facility];  Service: Gynecology;  Laterality: N/A;  . LAPAROSCOPIC OVARIAN CYSTECTOMY    . OOPHORECTOMY Left 06/08/2013   Procedure: OOPHORECTOMY;  Surgeon: Olga Millers, MD;  Location: [facility];  Service: Gynecology;  Laterality: Left;  . TUBAL LIGATION      Family History  Problem Relation Age of Onset  . Multiple sclerosis Mother   . Migraines Mother   . Diabetes Mother   . Hypertension Mother   . Cancer Father        melanoma  . Hypertension Sister   . ADD / ADHD Son   . ADD / ADHD Daughter   . Cancer Paternal Grandmother        unknown    SOCIAL HX: see hpi   Current Outpatient Medications:  .  cetirizine (ZYRTEC) 10 MG tablet, Take 10 mg by mouth daily as needed. , Disp: , Rfl:  .  Erenumab-aooe (AIMOVIG) 140 MG/ML SOAJ, Inject 140 mg into the skin every 30 (thirty) days., Disp: 1 pen, Rfl: 5 .  SUMAtriptan-naproxen (TREXIMET) 85-500 MG tablet, Take 1 tablet by mouth every 2 (two) hours as needed for migraine. Reported on 11/21/2015, Disp: , Rfl:  .  TROKENDI XR 100 MG CP24, TAKE 1 CAPSULE BY MOUTH EVERYDAY AT BEDTIME, Disp: 90 capsule, Rfl: 1 .  venlafaxine XR (EFFEXOR-XR) 75 MG 24 hr capsule, TAKE 1 CAPSULE BY MOUTH EVERY DAY, Disp:  90 capsule, Rfl: 1  EXAM:  VITALS per patient if applicable:denies fever  GENERAL: alert, oriented, appears well and in no acute distress  HEENT: atraumatic, conjunttiva clear, no obvious abnormalities on inspection of external nose and ears  NECK: normal movements of the head and neck  LUNGS: on inspection no signs of respiratory distress, breathing rate appears normal, no obvious gross SOB, gasping or wheezing  ABD: no focal TTP on self exam, but pressing on epigastric region makes her nauseious  CV: no obvious cyanosis  MS: moves all visible extremities without noticeable  abnormality  PSYCH/NEURO: pleasant and cooperative, no obvious depression or anxiety, speech and thought processing grossly intact  ASSESSMENT AND PLAN:  Discussed the following assessment and plan:  Nausea and vomiting, intractability of vomiting not specified, unspecified vomiting type Epigastric pain Fatigue, unspecified type   -we discussed possible serious and likely etiologies, workup and treatment, treatment risks and return precautions. Query resolving gastritisgastroenteritis since seems to have improved and started with 2 days of nausea and vomiting. Suspect possible tear or hemorrhoid from hard stool the cause of the sm amount of blood of TP, but discussed other potential etiologies as well.  -after this discussion, Barbara opted for short course of PPI - nexium 20mg  daily, no dairy for 1-2 weeks and prompt in-person [facility] visit if worsening, new symptoms any further bleeding or not improving over the next 24-48hours - at that point advised may need labs, imaging. She agreed and agreed to seek care. Discussed less likely, but possibility of atypical presentation of COVID19 as well.   I discussed the assessment and treatment plan with the patient. The patient was provided an opportunity to ask questions and all were answered. The patient agreed with the plan and demonstrated an understanding of the instructions.   The patient was advised to call back or seek an in-person evaluation if the symptoms worsen or if the condition fails to improve as anticipated.   Lucretia Kern, DO

## 2019-01-18 ENCOUNTER — Other Ambulatory Visit: Payer: Self-pay | Admitting: Neurology

## 2019-03-26 IMAGING — MG 2D DIGITAL DIAGNOSTIC UNILATERAL RIGHT MAMMOGRAM WITH CAD AND AD
9 of 12 series · 9 of 28 positions shown · non-contrast
Comparison: May 22, 2017 and earlier priors

CLINICAL DATA: 51-year-old patient recalled from recent screening
mammogram for evaluation of possible asymmetry in the right breast.

EXAM:
2D DIGITAL DIAGNOSTIC UNILATERAL RIGHT MAMMOGRAM WITH CAD AND
ADJUNCT TOMO

[R MLO synth-2D (1 of 2)]
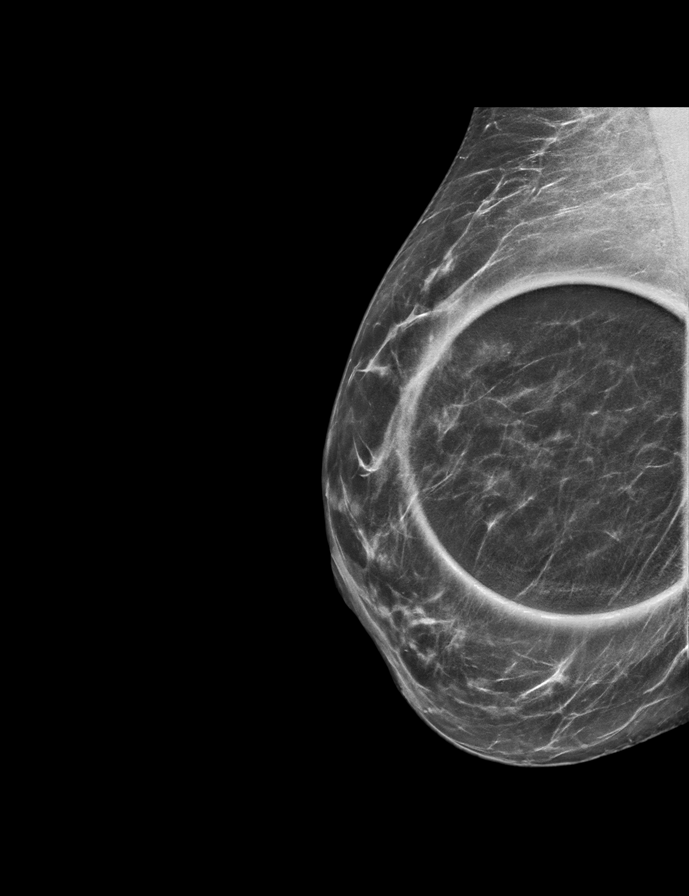

[R ML]
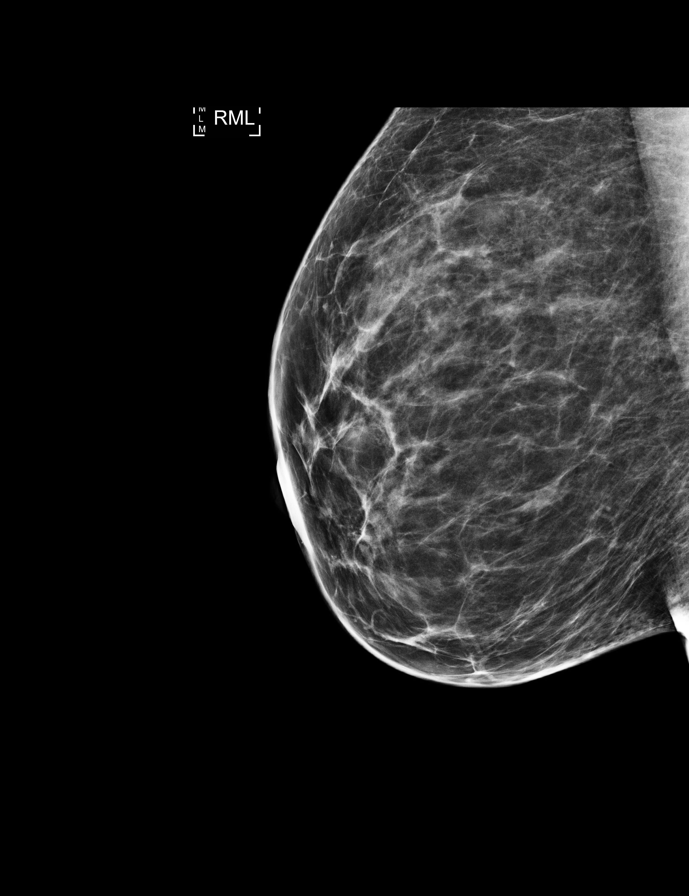

[R ML synth-2D]
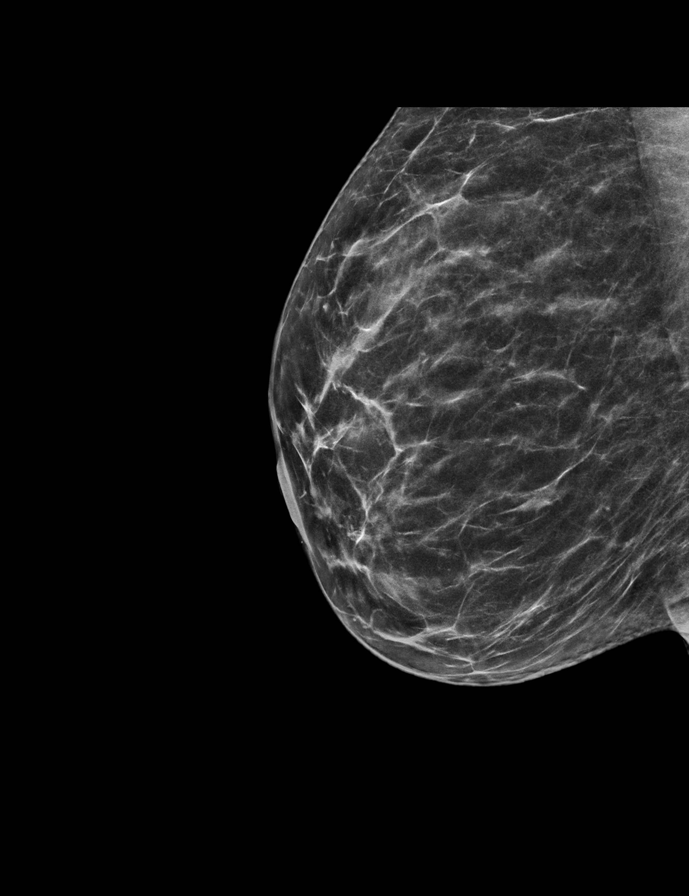

[R MLO (1 of 2)]
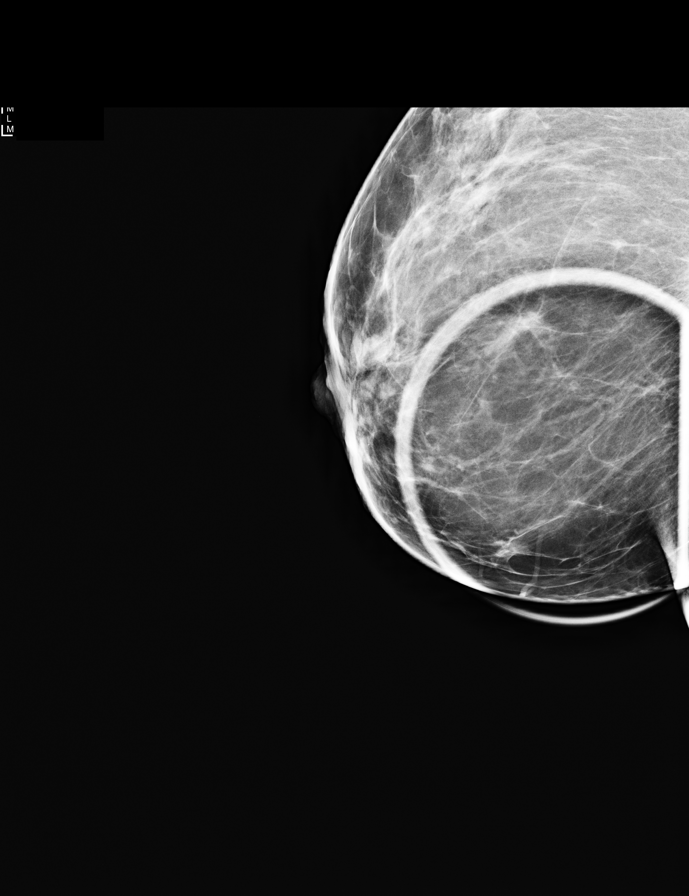

[R MLO synth-2D (2 of 2)]
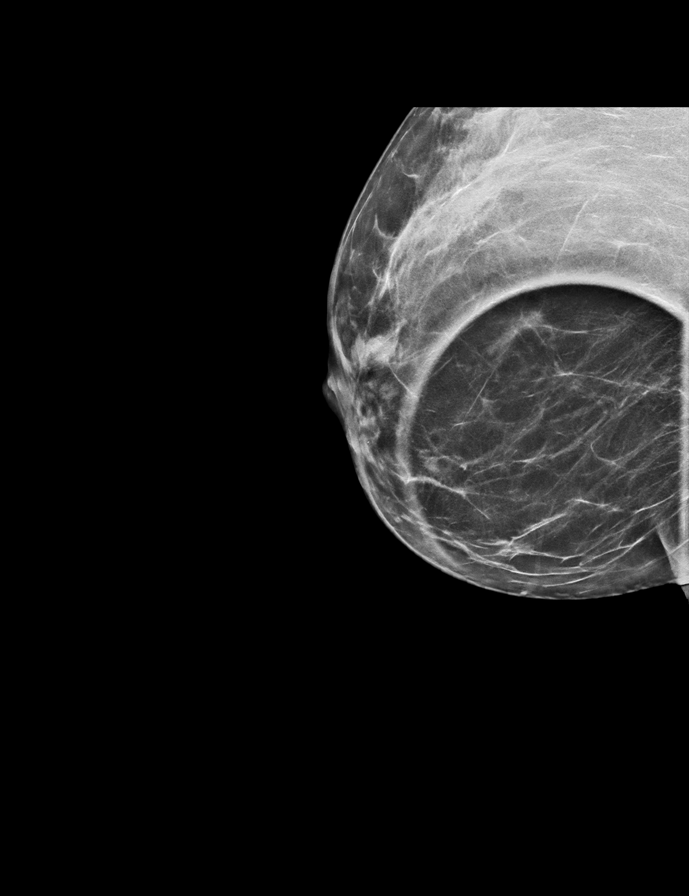

[R MLO (2 of 2)]
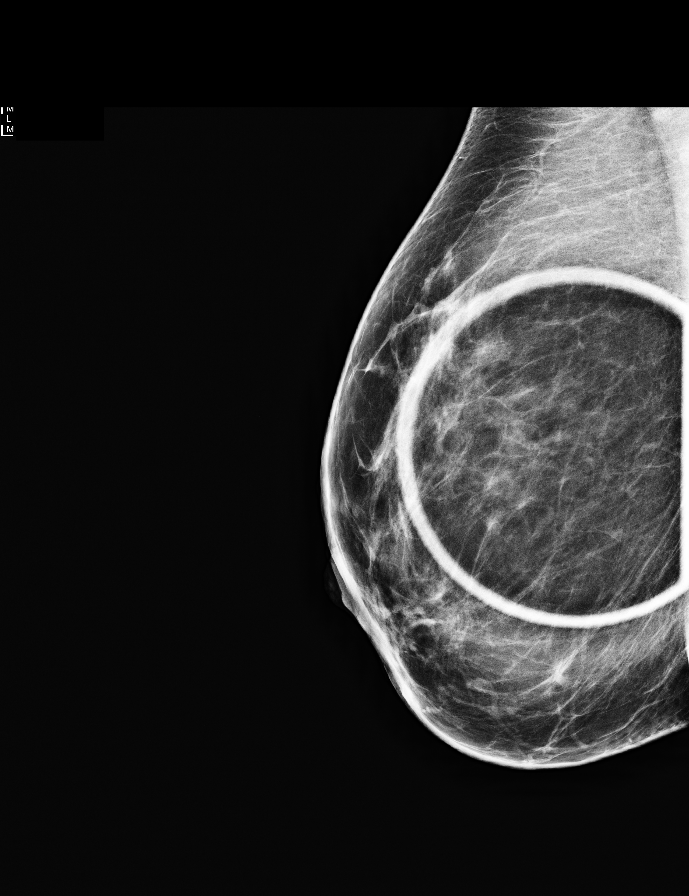

[R CC]
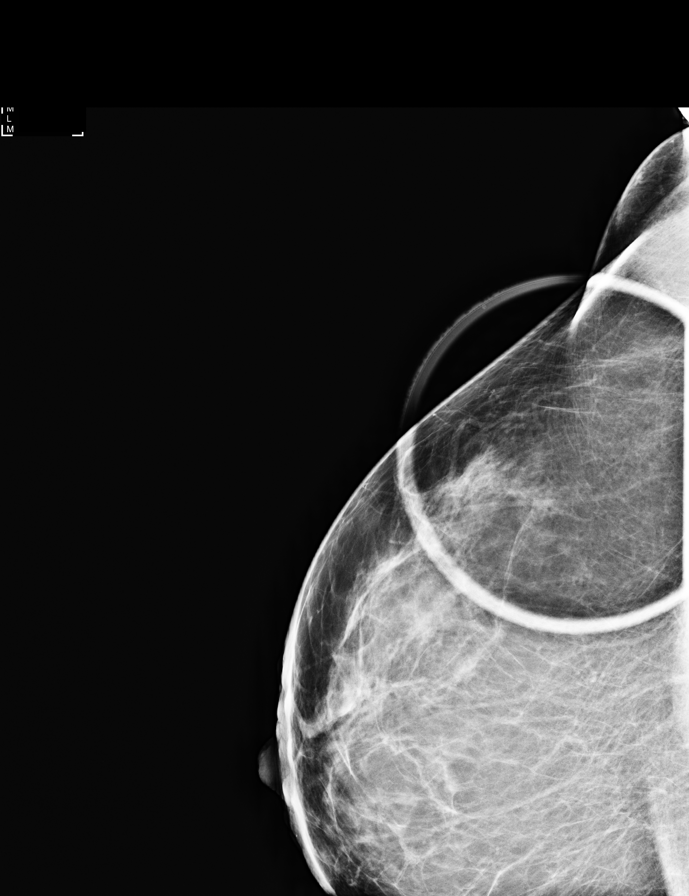

[R CC synth-2D]
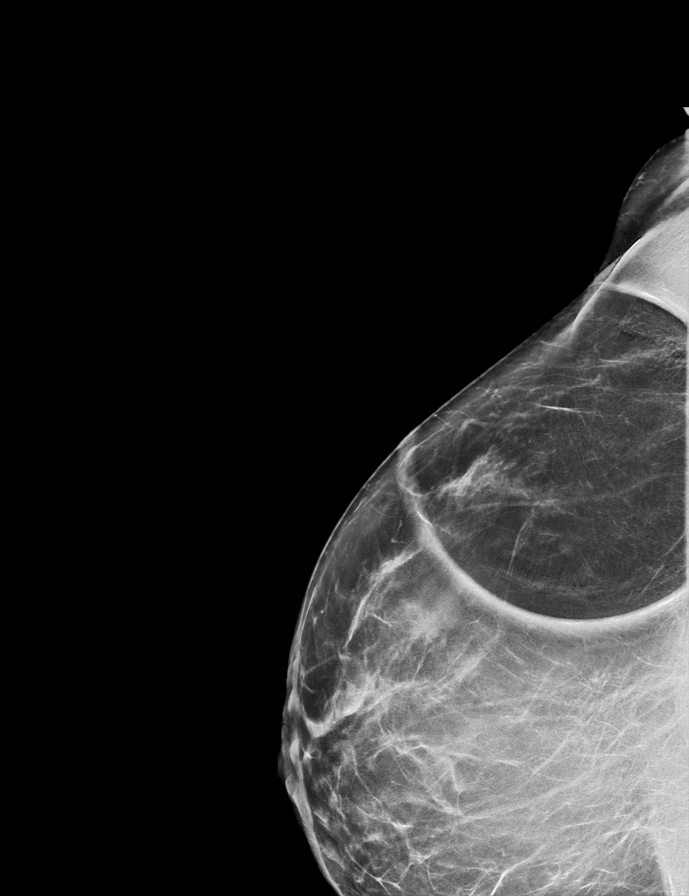

[R CC tomo · tomo slice 33/64.0]
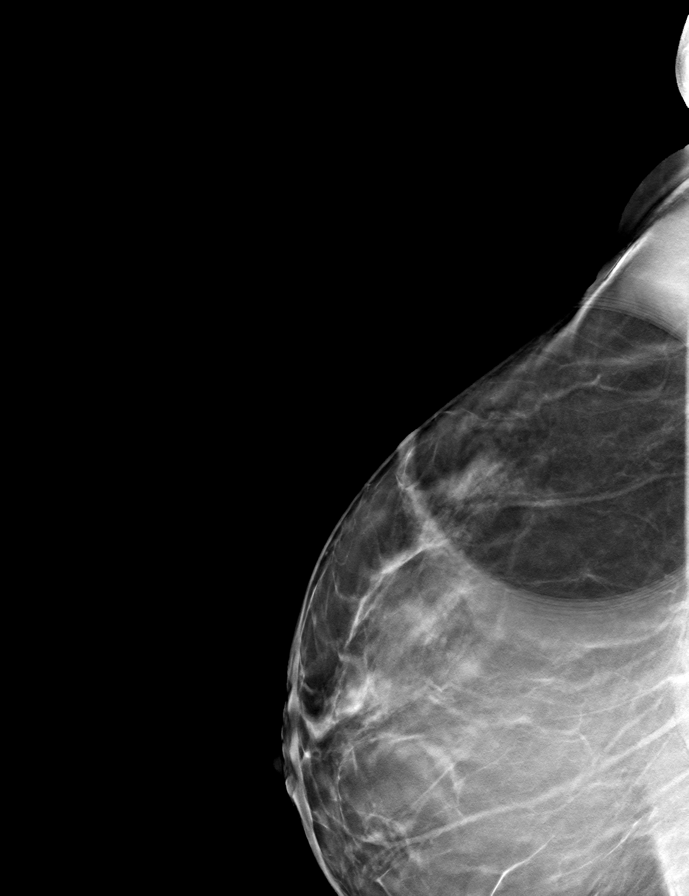

[9 of 28 positions shown; findings below may reference images not displayed]

ACR Breast Density Category b: There are scattered areas of
fibroglandular density.
FINDINGS: Focal spot compression views of the right breast and a 90 degree
lateral view of the right breast including tomography show no
evidence of mass or distortion. The possible asymmetry identified on
the screening mammogram is shown to be normal fibroglandular tissue
on today's additional images.

Mammographic images were processed with CAD.
IMPRESSION: No evidence of malignancy in the right breast.

RECOMMENDATION:
Screening mammogram in one year.(Code:[id number]-[id number])

I have discussed the findings and recommendations with the patient.
Results were also provided in writing at the conclusion of the
visit. If applicable, a reminder letter will be sent to the patient
regarding the next appointment.

BI-RADS CATEGORY  1: Negative.

## 2019-04-14 ENCOUNTER — Other Ambulatory Visit: Payer: Self-pay | Admitting: Neurology

## 2019-04-14 NOTE — Telephone Encounter (Signed)
Requested Prescriptions   Pending Prescriptions Disp Refills  . venlafaxine XR (EFFEXOR-XR) 75 MG 24 hr capsule [Pharmacy Med Name: VENLAFAXINE HCL ER 75 MG CAP] 90 capsule 1    Sig: TAKE 1 CAPSULE BY MOUTH EVERY DAY   Rx last filled:10/21/18 #90 1 REFILLS  Pt last seen:12/04/18  Follow up appt scheduled: NONE

## 2019-07-16 ENCOUNTER — Other Ambulatory Visit: Payer: Self-pay | Admitting: Neurology

## 2019-10-14 ENCOUNTER — Other Ambulatory Visit: Payer: Self-pay | Admitting: Neurology

## 2019-11-22 ENCOUNTER — Encounter: Payer: Self-pay | Admitting: Neurology

## 2019-11-22 NOTE — Progress Notes (Addendum)
Metztli Cunanan (Key: [id number]) Aimovig 70MG /ML auto-injectors   Form Charity fundraiser PA Form (2017 [employer]) Created 1 day ago Sent to Plan 5 hours ago Plan Response 5 hours ago Submit Clinical Questions 5 hours ago Determination Favorable 2 hours ago Message from [employer] Your PA request has been approved. Additional information will be provided in the approval communication. (Message 1145)

## 2019-11-22 NOTE — Progress Notes (Signed)
Letter rec'd  Aimovig 70 mg Approved 11/22/19-11/21/20

## 2019-12-17 ENCOUNTER — Encounter: Payer: Self-pay | Admitting: Neurology

## 2019-12-17 NOTE — Progress Notes (Addendum)
Barbara Maldonado (Key: [id number]) Aimovig 140MG /ML auto-injectors   Form Charity fundraiser PA Form (2017 NCPDP) Created 6 hours ago Sent to Plan 3 hours ago Plan Response 17 minutes ago Submit Clinical Questions Determination N/A Message from Plan Your PA has been resolved, no additional PA is required. For further inquiries please contact the number on the back of the member prescription card. (Message 1005)

## 2020-01-13 ENCOUNTER — Other Ambulatory Visit: Payer: Self-pay | Admitting: Neurology

## 2020-01-19 ENCOUNTER — Other Ambulatory Visit: Payer: Self-pay | Admitting: Neurology

## 2020-02-28 NOTE — Progress Notes (Signed)
NEUROLOGY FOLLOW UP OFFICE NOTE  Barbara Maldonado [id number]  HISTORY OF PRESENT ILLNESS: Barbara Maldonado is a 54 year old right-handed woman who follows up for migraine.  UPDATE: Last seen in June 2020.  Aimovig was increased to 140mg .  She did it for about 3 months.  After that, she forgot to pickup the prescription and hasn't taken it for about a year.  She has had increased headaches. Intensity: 6-7/10 Duration: Brief with sumatriptan and naproxen.  On a couple of occasions, needed to take second dose (2-3 hours) Frequency:3 to 5 days a week. Current NSAIDS: naproxen 550mg  (with sumatriptan) Current analgesics: no Current triptans: sumatriptan 100mg  (with naproxen) Current anti-emetic: no Current muscle relaxants: no Current anti-anxiolytic: no Current sleep aide: no Current Antihypertensive medications: no Current Antidepressant medications: venlafaxine XR 75mg  Current Anticonvulsant medications: topiramate ER 100mg  Current anti-CGRP:  Aimovig 140mg  Current Vitamins/Herbal/Supplements: no Current Antihistamines/Decongestants: Zyrtec Other therapy: no  Caffeine:cut down Alcohol:no Smoker:no Diet:Good.Drinks a lot of water Exercise:yes Depression/anxiety: No Sleep hygiene:Good  HISTORY: Onset:  Childhood Location:Left sided Quality:Pounding, sharp Initial Intensity:9-10; February: 6-7/10 Aura:no Prodrome:no Associated symptoms:  Nausea, photophobia, phonophobia, osmophobia, left ptosis.Sometimes vomiting. No unilateral numbness or weakness. Initial Duration:1-2 days; February: brief with sumatriptan/naproxen Initial Frequency:1 to 2 times a week.However, she also has daily headache Triggers: emotional stress, weather Relieving factors:  Hot towel on head, quiet Activity:Cannot function 2 times a month  Past abortive therapy:Excedrin, Maxalt, Relpax, Zomig po, Imitrex shot, Tylenol, ibuprofen, Goody Past  preventative therapy:zonisamide 50mg , nortriptyline (increased appetite), topiramate 100mg  twice daily (memory problems but effective).  Does not want to try a blood pressure medication as her blood pressure typically runs 90s/60s.   Family history of headache:mom  PAST MEDICAL HISTORY: Past Medical History:  Diagnosis Date  . Anxiety   . Basal cell carcinoma of skin 05/22/2017  . Depression   . Migraine   . PONV (postoperative nausea and vomiting)     MEDICATIONS: Current Outpatient Medications on File Prior to Visit  Medication Sig Dispense Refill  . cetirizine (ZYRTEC) 10 MG tablet Take 10 mg by mouth daily as needed.     Eduard Roux (AIMOVIG) 140 MG/ML SOAJ Inject 140 mg into the skin every 30 (thirty) days. 1 pen 5  . SUMAtriptan-naproxen (TREXIMET) 85-500 MG tablet Take 1 tablet by mouth every 2 (two) hours as needed for migraine. Reported on 11/21/2015    . TROKENDI XR 100 MG CP24 TAKE 1 CAPSULE BY MOUTH EVERYDAY AT BEDTIME 90 capsule 1  . venlafaxine XR (EFFEXOR-XR) 75 MG 24 hr capsule TAKE 1 CAPSULE BY MOUTH EVERY DAY 90 capsule 1   No current facility-administered medications on file prior to visit.    ALLERGIES: No Known Allergies  FAMILY HISTORY: Family History  Problem Relation Age of Onset  . Multiple sclerosis Mother   . Migraines Mother   . Diabetes Mother   . Hypertension Mother   . Cancer Father        melanoma  . Hypertension Sister   . ADD / ADHD Son   . ADD / ADHD Daughter   . Cancer Paternal Grandmother        unknown    SOCIAL HISTORY: Social History   Socioeconomic History  . Marital status: Married    Spouse name: Not on file  . Number of children: Not on file  . Years of education: Not on file  . Highest education level: Not on file  Occupational History  . Not on file  Tobacco  Use  . Smoking status: Never Smoker  . Smokeless tobacco: Never Used  Substance and Sexual Activity  . Alcohol use: No    Alcohol/week: 0.0 standard  drinks  . Drug use: No  . Sexual activity: Yes    Partners: Male  Other Topics Concern  . Not on file  Social History Narrative   Right handed   Lives in a single story home with granddaughter and husband.   Social Determinants of Health   Financial Resource Strain:   . Difficulty of Paying Living Expenses: Not on file  Food Insecurity:   . Worried About Charity fundraiser in the Last Year: Not on file  . Ran Out of Food in the Last Year: Not on file  Transportation Needs:   . Lack of Transportation (Medical): Not on file  . Lack of Transportation (Non-Medical): Not on file  Physical Activity:   . Days of Exercise per Week: Not on file  . Minutes of Exercise per Session: Not on file  Stress:   . Feeling of Stress : Not on file  Social Connections:   . Frequency of Communication with Friends and Family: Not on file  . Frequency of Social Gatherings with Friends and Family: Not on file  . Attends Religious Services: Not on file  . Active Member of Clubs or Organizations: Not on file  . Attends Archivist Meetings: Not on file  . Marital Status: Not on file  Intimate Partner Violence:   . Fear of Current or Ex-Partner: Not on file  . Emotionally Abused: Not on file  . Physically Abused: Not on file  . Sexually Abused: Not on file    PHYSICAL EXAM: Blood pressure 121/77, pulse 82, height 5\' 3"  (1.6 m), weight 147 lb 12.8 oz (67 kg), SpO2 98 %. General: No acute distress.  Patient appears well-groomed.   Head:  Normocephalic/atraumatic Eyes:  Fundi examined but not visualized Neck: supple, no paraspinal tenderness, full range of motion Heart:  Regular rate and rhythm Lungs:  Clear to auscultation bilaterally Back: No paraspinal tenderness Neurological Exam: alert and oriented to person, place, and time. Attention span and concentration intact, recent and remote memory intact, fund of knowledge intact.  Speech fluent and not dysarthric, language intact.  CN II-XII  intact. Bulk and tone normal, muscle strength 5/5 throughout.  Sensation to light touch, temperature and vibration intact.  Deep tendon reflexes 2+ throughout, toes downgoing.  Finger to nose and heel to shin testing intact.  Gait normal, Romberg negative.  IMPRESSION: Migraine without aura, without status migrainosus, not intractable  PLAN: 1.  For preventative management, restart Aimovig 140mg  every 28 days (she will take it as directed for 6 months) and venlafaxine XR 75mg  daily 2.  For abortive therapy, sumatriptan with naproxen 3.  Limit use of pain relievers to no more than 2 days out of week to prevent risk of rebound or medication-overuse headache. 4.  Keep headache diary 5.  Exercise, hydration, caffeine cessation, sleep hygiene, monitor for and avoid triggers 6.  Follow up 6 months.   Metta Clines, DO  CC: Betty Martinique, MD

## 2020-02-29 ENCOUNTER — Encounter: Payer: Self-pay | Admitting: Neurology

## 2020-02-29 ENCOUNTER — Ambulatory Visit: Payer: 59 | Admitting: Neurology

## 2020-02-29 ENCOUNTER — Other Ambulatory Visit: Payer: Self-pay

## 2020-02-29 VITALS — BP 121/77 | HR 82 | Ht 63.0 in | Wt 147.8 lb

## 2020-02-29 DIAGNOSIS — G43709 Chronic migraine without aura, not intractable, without status migrainosus: Secondary | ICD-10-CM

## 2020-02-29 MED ORDER — AIMOVIG 140 MG/ML ~~LOC~~ SOAJ: 140.0000 mg | SUBCUTANEOUS | 11 refills | Status: DC

## 2020-02-29 NOTE — Patient Instructions (Signed)
  1. Start Aimovig 140mg  every 28 days.  Stop Trokendi 2. Take sumatriptan with naproxen at earliest onset of headache.  May repeat dose once in 2 hours if needed.  Maximum 2 tablets in 24 hours. 3. Limit use of pain relievers to no more than 2 days out of the week.  These medications include acetaminophen, NSAIDs (ibuprofen/Advil/Motrin, naproxen/Aleve, triptans (Imitrex/sumatriptan), Excedrin, and narcotics.  This will help reduce risk of rebound headaches. 4. Be aware of common food triggers:  - Caffeine:  coffee, black tea, cola, Mt. Dew  - Chocolate  - Dairy:  aged cheeses (brie, blue, cheddar, gouda, Angus, provolone, [place], Swiss, etc), chocolate milk, buttermilk, sour cream, limit eggs and yogurt  - Nuts, peanut butter  - Alcohol  - Cereals/grains:  FRESH breads (fresh bagels, sourdough, doughnuts), yeast productions  - Processed/canned/aged/cured meats (pre-packaged deli meats, hotdogs)  - MSG/glutamate:  soy sauce, flavor enhancer, pickled/preserved/marinated foods  - Sweeteners:  aspartame (Equal, Nutrasweet).  Sugar and Splenda are okay  - Vegetables:  legumes (lima beans, lentils, snow peas, fava beans, pinto peans, peas, garbanzo beans), sauerkraut, onions, olives, pickles  - Fruit:  avocados, bananas, citrus fruit (orange, lemon, grapefruit), mango  - Other:  Frozen meals, macaroni and cheese 5. Routine exercise 6. Stay adequately hydrated (aim for 64 oz water daily) 7. Keep headache diary 8. Maintain proper stress management 9. Maintain proper sleep hygiene 10. Do not skip meals 11. Consider supplements:  magnesium citrate 400mg  daily, riboflavin 400mg  daily, coenzyme Q10 100mg  three times daily.

## 2020-03-02 ENCOUNTER — Other Ambulatory Visit: Payer: Self-pay | Admitting: Neurology

## 2020-03-02 NOTE — Progress Notes (Signed)
Barbara Maldonado (Key: [id number])  Your information has been submitted to [place]. To check for an updated outcome later, reopen this PA request from your dashboard.  If [employer] has not responded to your request within 24 hours, contact [place] at [phone]. If you think there may be a problem with your PA request, use our live chat feature at the bottom right.

## 2020-03-02 NOTE — Progress Notes (Signed)
Dustin Flock (Key: [id number])  Your demographic data has been sent to [place] successfully!  [employer] typically takes 5-10 minutes to respond, but it may take a little longer in some cases. You will be notified by email when available. You can also check for an update later by opening this request from your dashboard. Please do not fax or call [employer] to resubmit this request. If you need assistance, please chat with CoverMyMeds or call us at [phone].  If it has been longer than 24 hours, please reach out to [place].

## 2020-03-03 NOTE — Progress Notes (Signed)
[facility] recently received a prior authorization request from your doctor for coverage of Aimovig 140MG /ML [place] SOAJ for you. After reviewing the [place] established and approved criteria for this drug with your doctor, the request was approved for the following time period: 03/02/2020 - 06/01/2020

## 2020-03-22 ENCOUNTER — Encounter: Payer: Self-pay | Admitting: Family Medicine

## 2020-03-22 ENCOUNTER — Other Ambulatory Visit: Payer: Self-pay

## 2020-03-22 ENCOUNTER — Ambulatory Visit: Payer: 59 | Admitting: Family Medicine

## 2020-03-22 VITALS — BP 120/70 | HR 91 | Resp 16 | Ht 63.0 in | Wt 149.0 lb

## 2020-03-22 DIAGNOSIS — L309 Dermatitis, unspecified: Secondary | ICD-10-CM | POA: Diagnosis not present

## 2020-03-22 DIAGNOSIS — L219 Seborrheic dermatitis, unspecified: Secondary | ICD-10-CM

## 2020-03-22 MED ORDER — DESONIDE 0.05 % EX CREA: TOPICAL_CREAM | Freq: Two times a day (BID) | CUTANEOUS | 0 refills | Status: AC

## 2020-03-22 MED ORDER — KETOCONAZOLE 2 % EX CREA: 1.0000 "application " | TOPICAL_CREAM | Freq: Every day | CUTANEOUS | 0 refills | Status: AC

## 2020-03-22 MED ORDER — DESONIDE 0.05 % EX CREA: TOPICAL_CREAM | Freq: Two times a day (BID) | CUTANEOUS | 0 refills | Status: DC

## 2020-03-22 NOTE — Patient Instructions (Signed)
A few things to remember from today's visit:   Facial dermatitis - Plan: desonide (DESOWEN) 0.05 % cream  Seborrheic dermatitis - Plan: ketoconazole (NIZORAL) 2 % cream  Please be sure medication list is accurate. If a new problem present, please set up appointment sooner than planned today.  I can not advise against wearing mask at work but if your job can be done from home I recommend doing so.  Please arrange appointment with your dermatologist in case problem doe snot improve and if you need longer treatments.

## 2020-03-22 NOTE — Progress Notes (Addendum)
Chief Complaint  Patient presents with  . facial breakout   HPI: [name] is a 54 y.o. female, who is here today complaining of constant erythematous pruritic facial rash. Rash noted since she is been required to wearing mask. Rash alleviated when she was working from home most days out of the week and was not wearing mask. She has tried masks made from different materials.  "Bumpy" face and erythematous.  She has had blisters and upon scratching it burns. She has used OTC steroid cream and her sister's eczema prescriptions.  She tried to schedule an appt with her dermatologist but cannot be seen before 05/2020. Reports hx of eczema that usually affects area in between eye brows.  Negative for fever,chills,changes in appetite,sore throat,oral lesions,or dysphagia.  She has not had COVID 19 vaccine.  Review of Systems  Constitutional: Negative for activity change and fatigue.  HENT: Negative for congestion and trouble swallowing.   Eyes: Negative for discharge and redness.  Respiratory: Negative for cough, shortness of breath and wheezing.   Cardiovascular: Negative for chest pain, palpitations and leg swelling.  Musculoskeletal: Negative for arthralgias and myalgias.  Allergic/Immunologic: Positive for environmental allergies.  Neurological: Negative for weakness and headaches.  Rest see pertinent positives and negatives per HPI.  Current Outpatient Medications on File Prior to Visit  Medication Sig Dispense Refill  . cetirizine (ZYRTEC) 10 MG tablet Take 10 mg by mouth daily as needed.     Barbara Maldonado (AIMOVIG) 140 MG/ML SOAJ Inject 140 mg into the skin every 28 (twenty-eight) days. 1.12 mL 11  . naproxen sodium (ANAPROX) 550 MG tablet TAKE 1 TABLET BY MOUTH WITH EACH DOSE OF SUMATRIPTAN 10 tablet 5  . SUMAtriptan (IMITREX) 100 MG tablet TAKE 1 TABLET BY MOUTH AT EARLIEST ONSET OF HEADACHE. MAY REPEAT IN 2 HOURS IF NEEDED. DO NOT EXCEED 10 tablet 5  .  SUMAtriptan-naproxen (TREXIMET) 85-500 MG tablet Take 1 tablet by mouth every 2 (two) hours as needed for migraine. Reported on 11/21/2015    . venlafaxine XR (EFFEXOR-XR) 75 MG 24 hr capsule TAKE 1 CAPSULE BY MOUTH EVERY DAY 90 capsule 5   No current facility-administered medications on file prior to visit.     Past Medical History:  Diagnosis Date  . Anxiety   . Basal cell carcinoma of skin 05/22/2017  . Depression   . Migraine   . PONV (postoperative nausea and vomiting)    No Known Allergies  Social History   Socioeconomic History  . Marital status: Married    Spouse name: Not on file  . Number of children: Not on file  . Years of education: Not on file  . Highest education level: Not on file  Occupational History  . Not on file  Tobacco Use  . Smoking status: Never Smoker  . Smokeless tobacco: Never Used  Substance and Sexual Activity  . Alcohol use: No    Alcohol/week: 0.0 standard drinks  . Drug use: No  . Sexual activity: Yes    Partners: Male  Other Topics Concern  . Not on file  Social History Narrative   Right handed   Lives in a single story home with granddaughter and husband.   Social Determinants of Health   Financial Resource Strain:   . Difficulty of Paying Living Expenses: Not on file  Food Insecurity:   . Worried About Charity fundraiser in the Last Year: Not on file  . Ran Out of Food in the  Last Year: Not on file  Transportation Needs:   . Lack of Transportation (Medical): Not on file  . Lack of Transportation (Non-Medical): Not on file  Physical Activity:   . Days of Exercise per Week: Not on file  . Minutes of Exercise per Session: Not on file  Stress:   . Feeling of Stress : Not on file  Social Connections:   . Frequency of Communication with Friends and Family: Not on file  . Frequency of Social Gatherings with Friends and Family: Not on file  . Attends Religious Services: Not on file  . Active Member of Clubs or Organizations: Not  on file  . Attends Archivist Meetings: Not on file  . Marital Status: Not on file    Vitals:   03/22/20 1533  BP: 120/70  Pulse: 91  Resp: 16  SpO2: 98%   Body mass index is 26.39 kg/m.  Physical Exam Vitals and nursing note reviewed.  Constitutional:      General: She is not in acute distress.    Appearance: She is well-developed.  HENT:     Head: Normocephalic and atraumatic.     Mouth/Throat:     Mouth: Mucous membranes are moist.     Pharynx: Oropharynx is clear.  Eyes:     Conjunctiva/sclera: Conjunctivae normal.  Pulmonary:     Effort: Pulmonary effort is normal. No respiratory distress.     Breath sounds: Normal breath sounds.  Lymphadenopathy:     Cervical: No cervical adenopathy.  Skin:    General: Skin is warm.     Findings: Rash present.     Comments: Erythematous maculopapular rash affecting nasal labial fold, bilateral.  Neurological:     General: No focal deficit present.     Mental Status: She is alert and oriented to person, place, and time.   ASSESSMENT AND PLAN:  [name] was seen today for facial breakout.  Diagnoses and all orders for this visit:  Facial dermatitis -     Discontinue: desonide (DESOWEN) 0.05 % cream; Apply topically 2 (two) times daily for 14 days. -     desonide (DESOWEN) 0.05 % cream; Apply topically 2 (two) times daily for 14 days.  Seborrheic dermatitis -     ketoconazole (NIZORAL) 2 % cream; Apply 1 application topically daily for 14 days.   We discussed possible etiology. It seems to be a chronic problem with an acute exacerbation.  Distribution does not involve all area in contact with mask. Hx and clinical findings suggest seborrheic dermatitis. We discussed Dx, prognosis,and treatment options. Caution with topical steroid on face,so side effects discussed. Desonide cream ,small amount bid x 14 days and Ketoconazole cream.  Not interested in COVID 19 vaccination and does not believe mask will keep  her from getting disease. She was working from home and able to to perform all functions in her job description. So letter for employer given to allow her to work remotely.  Recommend arranging appt with dermatologist for follow up.  Return if symptoms worsen or fail to improve.   Barbara Grippi G. Martinique, MD  [facility]. [place] office.  A few things to remember from today's visit:   Facial dermatitis - Plan: desonide (DESOWEN) 0.05 % cream  Seborrheic dermatitis - Plan: ketoconazole (NIZORAL) 2 % cream  Please be sure medication list is accurate. If a new problem present, please set up appointment sooner than planned today.  I can not advise against wearing mask at work but if  your job can be done from home I recommend doing so.  Please arrange appointment with your dermatologist in case problem doe snot improve and if you need longer treatments.

## 2020-03-23 ENCOUNTER — Encounter: Payer: Self-pay | Admitting: Family Medicine

## 2020-03-23 NOTE — Addendum Note (Signed)
Addended by: Martinique, Dakin Madani G on: 03/23/2020 03:27 PM   Modules accepted: Level of Service

## 2020-06-02 ENCOUNTER — Encounter: Payer: Self-pay | Admitting: Neurology

## 2020-06-02 NOTE — Progress Notes (Addendum)
RIONA LAHTI (Key: [id number]) Aimovig 140MG /ML auto-injectors   Form [employer] Electronic PA Form [phone] NCPDP) Created 20 hours ago Sent to Plan 4 hours ago Plan Response 4 hours ago Submit Clinical Questions 4 hours ago Determination Favorable 3 minutes ago Message from [employer] Your PA request has been approved. Additional information will be provided in the approval communication. (Message 1145)  Approval valid from 06/02/20 to 06/02/21.

## 2020-07-17 ENCOUNTER — Telehealth: Payer: Self-pay | Admitting: Family Medicine

## 2020-07-17 NOTE — Telephone Encounter (Signed)
If at least 10 days since symptoms started and acute symptoms resolved, she can go back to work. Continue contact precautions. Thanks, BJ

## 2020-07-17 NOTE — Telephone Encounter (Signed)
Pt Left message with after hours triage stating she tested positive on 07/07/20 and her work says that she has to have a negative test to return. Patient wants to know if Dr. Martinique can write a work note to go back to work if she doesn't have any symptoms? Please Advise.

## 2020-07-17 NOTE — Telephone Encounter (Signed)
Pt tested positive on 07/07/20, is it okay for her to return to work? Today makes 10 days.

## 2020-07-18 ENCOUNTER — Encounter: Payer: Self-pay | Admitting: Family Medicine

## 2020-07-18 NOTE — Telephone Encounter (Signed)
I spoke with pt. She will come pick up the letter tomorrow morning. Letter has been placed up front.

## 2020-08-28 NOTE — Progress Notes (Signed)
NEUROLOGY FOLLOW UP OFFICE NOTE  Barbara Maldonado [id number]  Assessment/Plan:   Chronic migraine without aura, without status migrainosus, not intractable -   She has at least 15 headache days a month for at least 3 consecutive months.  She has failed multiple migraine preventatives such as Aimovig, venlafaxine, topiramate, zonisamide, and nortriptyline.  Due to baseline low blood pressure, cannot take beta blocker.  She meets criteria for Botox.  1.  Migraine prevention:  Will submit request to start Botox.  Increase venlafaxine XR to 150mg  daily.  Discontinue Aimovig 140mg  2.  Migraine rescue:  Sumatriptan 100mg  with naproxen 500mg  3.  Limit use of pain relievers to no more than 2 days out of week to prevent risk of rebound or medication-overuse headache. 4.  Keep headache diary 5.  Follow up for Botox  Subjective:  Barbara Maldonado is a 55 year old right-handed woman whofollows up for migraine.  UPDATE: Intensity: 6-7/10 Duration: Brief with sumatriptan and naproxen. On a couple of occasions, needed to take second dose (2-3 hours) Frequency:5days a week.  She was diagnosed with COVID-19 in January.  She had intractable migraine requiring a headache cocktail at the ED.    Rescue therapy:  Sumatriptan 100mg  with naproxen 500mg ; sometimes may take Tylenol Migraine Current NSAIDS: naproxen 550mg  (with sumatriptan), Tylenol Migraine  Current analgesics: no Current triptans: sumatriptan 100mg  (with naproxen) Current anti-emetic: no Current muscle relaxants: no Current anti-anxiolytic: no Current sleep aide: no Current Antihypertensive medications: no Current Antidepressant medications: venlafaxine XR 75mg  Current Anticonvulsant medications: topiramate ER 100mg  Current anti-CGRP: Aimovig 140mg  Current Vitamins/Herbal/Supplements: no Current Antihistamines/Decongestants: Zyrtec Other therapy: no  Caffeine:cut  down Alcohol:no Smoker:no Diet:Good.Drinks a lot of water Exercise:yes Depression/anxiety: No Sleep hygiene:Good  HISTORY: Onset: Childhood Location:Left sided Quality:Pounding, sharp Initial Intensity:9-10; February: 6-7/10 Aura:no Prodrome:no Associated symptoms: Nausea,photophobia, phonophobia, osmophobia, left ptosis.Sometimes vomiting. No unilateral numbness or weakness. Initial Duration:1-2 days; February: brief with sumatriptan/naproxen Initial Frequency:1 to 2 times a week.However, she also has daily headache Triggers: emotional stress, weather Relieving factors: Hot towel on head, quiet Activity:Cannot function 2 times a month  Past abortive therapy:Excedrin, Maxalt, Relpax, Zomig po, Imitrex shot, Tylenol, ibuprofen, Goody Past preventative therapy:zonisamide 50mg , nortriptyline (increased appetite), topiramate 100mg  twice daily (memory problems but effective).  Does not want to try a blood pressure medication as her blood pressure typically runs 90s/60s.   Family history of headache:mom  PAST MEDICAL HISTORY: Past Medical History:  Diagnosis Date  . Anxiety   . Basal cell carcinoma of skin 05/22/2017  . Depression   . Migraine   . PONV (postoperative nausea and vomiting)     MEDICATIONS: Current Outpatient Medications on File Prior to Visit  Medication Sig Dispense Refill  . cetirizine (ZYRTEC) 10 MG tablet Take 10 mg by mouth daily as needed.     Eduard Roux (AIMOVIG) 140 MG/ML SOAJ Inject 140 mg into the skin every 28 (twenty-eight) days. 1.12 mL 11  . naproxen sodium (ANAPROX) 550 MG tablet TAKE 1 TABLET BY MOUTH WITH EACH DOSE OF SUMATRIPTAN 10 tablet 5  . SUMAtriptan (IMITREX) 100 MG tablet TAKE 1 TABLET BY MOUTH AT EARLIEST ONSET OF HEADACHE. MAY REPEAT IN 2 HOURS IF NEEDED. DO NOT EXCEED 10 tablet 5  . SUMAtriptan-naproxen (TREXIMET) 85-500 MG tablet Take 1 tablet by mouth every 2 (two) hours as needed for  migraine. Reported on 11/21/2015    . venlafaxine XR (EFFEXOR-XR) 75 MG 24 hr capsule TAKE 1 CAPSULE BY MOUTH EVERY DAY 90 capsule 5  No current facility-administered medications on file prior to visit.    ALLERGIES: No Known Allergies  FAMILY HISTORY: Family History  Problem Relation Age of Onset  . Multiple sclerosis Mother   . Migraines Mother   . Diabetes Mother   . Hypertension Mother   . Cancer Father        melanoma  . Hypertension Sister   . ADD / ADHD Son   . ADD / ADHD Daughter   . Cancer Paternal Grandmother        unknown      Objective:  Blood pressure 95/70, pulse 87, resp. rate 18, height 5\' 3"  (1.6 m), weight 150 lb (68 kg), SpO2 98 %. General: No acute distress.  Patient appears well-groomed.       Metta Clines, DO  CC: Betty Martinique, MD

## 2020-08-29 ENCOUNTER — Ambulatory Visit (INDEPENDENT_AMBULATORY_CARE_PROVIDER_SITE_OTHER): Payer: 59 | Admitting: Neurology

## 2020-08-29 ENCOUNTER — Other Ambulatory Visit: Payer: Self-pay

## 2020-08-29 ENCOUNTER — Encounter: Payer: Self-pay | Admitting: Neurology

## 2020-08-29 VITALS — BP 95/70 | HR 87 | Resp 18 | Ht 63.0 in | Wt 150.0 lb

## 2020-08-29 DIAGNOSIS — G43709 Chronic migraine without aura, not intractable, without status migrainosus: Secondary | ICD-10-CM | POA: Diagnosis not present

## 2020-08-29 MED ORDER — VENLAFAXINE HCL ER 150 MG PO CP24: 150.0000 mg | ORAL_CAPSULE | Freq: Every day | ORAL | 5 refills | Status: DC

## 2020-08-29 NOTE — Patient Instructions (Signed)
1.  Will start Botox 2.  Stop Aimovig 3.  Increase venlafaxine XR to 150mg  daily 4.  Use sumatriptan with naproxen as needed but Limit use of pain relievers to no more than 2 days out of week to prevent risk of rebound or medication-overuse headache. 5.  Keep headache diary

## 2020-09-04 ENCOUNTER — Encounter: Payer: Self-pay | Admitting: Neurology

## 2020-09-04 NOTE — Progress Notes (Addendum)
Per BV: PA is required but must BUY and BILL ONLY 2022.   Dustin Flock Key: [id number] - PA Case ID: [id number] Need help? Call us at [phone] Outcome Approvedtoday Your PA request has been approved. Valid from 09/04/20 to 09/04/21.   Additional information will be provided in the approval communication. (Message 1145) Drug Botox 200UNIT solution Form [employer] Electronic PA Form [phone] NCPDP)

## 2020-09-20 ENCOUNTER — Other Ambulatory Visit: Payer: Self-pay | Admitting: Neurology

## 2020-09-22 ENCOUNTER — Other Ambulatory Visit: Payer: Self-pay

## 2020-09-22 ENCOUNTER — Ambulatory Visit (INDEPENDENT_AMBULATORY_CARE_PROVIDER_SITE_OTHER): Payer: 59 | Admitting: Neurology

## 2020-09-22 DIAGNOSIS — G43709 Chronic migraine without aura, not intractable, without status migrainosus: Secondary | ICD-10-CM | POA: Diagnosis not present

## 2020-09-22 MED ORDER — ONABOTULINUMTOXINA 100 UNITS IJ SOLR
155.0000 [IU] | Freq: Once | INTRAMUSCULAR | Status: AC
  Administered 2020-09-22: 155 [IU] via INTRAMUSCULAR

## 2020-09-22 NOTE — Progress Notes (Signed)
[facility]   Procedure Note Botox  Attending: Dr. Metta Clines  Preoperative Diagnosis(es): Chronic migraine  Consent obtained from: The patient Benefits discussed included, but were not limited to decreased muscle tightness, increased joint range of motion, and decreased pain.  Risk discussed included, but were not limited pain and discomfort, bleeding, bruising, excessive weakness, venous thrombosis, muscle atrophy and dysphagia.  Anticipated outcomes of the procedure as well as he risks and benefits of the alternatives to the procedure, and the roles and tasks of the personnel to be involved, were discussed with the patient, and the patient consents to the procedure and agrees to proceed. A copy of the patient medication guide was given to the patient which explains the blackbox warning.  Patients identity and treatment sites confirmed Yes.  .  Details of Procedure: Skin was cleaned with alcohol. Prior to injection, the needle plunger was aspirated to make sure the needle was not within a blood vessel.  There was no blood retrieved on aspiration.    Following is a summary of the muscles injected  And the amount of Botulinum toxin used:  Dilution 200 units of Botox was reconstituted with 4 ml of preservative free normal saline. Time of reconstitution: At the time of the office visit (<30 minutes prior to injection)   Injections  155 total units of Botox was injected with a 30 gauge needle.  Injection Sites: L occipitalis: 15 units- 3 sites  R occiptalis: 15 units- 3 sites  L upper trapezius: 15 units- 3 sites R upper trapezius: 15 units- 3 sits          L paraspinal: 10 units- 2 sites R paraspinal: 10 units- 2 sites  Face L frontalis(2 injection sites):10 units   R frontalis(2 injection sites):10 units         L corrugator: 5 units   R corrugator: 5 units           Procerus: 5 units   L temporalis: 20 units R temporalis: 20 units   Agent:  200 units of botulinum Type  A (Onobotulinum Toxin type A) was reconstituted with 4 ml of preservative free normal saline.  Time of reconstitution: At the time of the office visit (<30 minutes prior to injection)     Total injected (Units): 155  Total wasted (Units): none wasted  Patient tolerated procedure well without complications.   Reinjection is anticipated in 3 months.

## 2020-10-27 ENCOUNTER — Ambulatory Visit: Payer: 59 | Admitting: Neurology

## 2020-12-22 ENCOUNTER — Other Ambulatory Visit: Payer: Self-pay

## 2020-12-22 ENCOUNTER — Ambulatory Visit: Payer: 59 | Admitting: Neurology

## 2020-12-22 DIAGNOSIS — G43709 Chronic migraine without aura, not intractable, without status migrainosus: Secondary | ICD-10-CM | POA: Diagnosis not present

## 2020-12-22 MED ORDER — ONABOTULINUMTOXINA 100 UNITS IJ SOLR
160.0000 [IU] | Freq: Once | INTRAMUSCULAR | Status: AC
  Administered 2020-12-22: 160 [IU] via INTRAMUSCULAR

## 2020-12-22 NOTE — Addendum Note (Signed)
Addended by: Venetia Night on: 12/22/2020 04:18 PM   Modules accepted: Orders

## 2020-12-22 NOTE — Progress Notes (Signed)
[facility]   Procedure Note Botox  Attending: Dr. Metta Clines  Preoperative Diagnosis(es): Chronic migraine  Consent obtained from: The patient Benefits discussed included, but were not limited to decreased muscle tightness, increased joint range of motion, and decreased pain.  Risk discussed included, but were not limited pain and discomfort, bleeding, bruising, excessive weakness, venous thrombosis, muscle atrophy and dysphagia.  Anticipated outcomes of the procedure as well as he risks and benefits of the alternatives to the procedure, and the roles and tasks of the personnel to be involved, were discussed with the patient, and the patient consents to the procedure and agrees to proceed. A copy of the patient medication guide was given to the patient which explains the blackbox warning.  Patients identity and treatment sites confirmed Yes.  .  Details of Procedure: Skin was cleaned with alcohol. Prior to injection, the needle plunger was aspirated to make sure the needle was not within a blood vessel.  There was no blood retrieved on aspiration.    Following is a summary of the muscles injected  And the amount of Botulinum toxin used:  Dilution 200 units of Botox was reconstituted with 4 ml of preservative free normal saline. Time of reconstitution: At the time of the office visit (<30 minutes prior to injection)   Injections  155 total units of Botox was injected with a 30 gauge needle.  Injection Sites: L occipitalis: 15 units- 3 sites  R occiptalis: 15 units- 3 sites  L upper trapezius: 15 units- 3 sites R upper trapezius: 15 units- 3 sits          L paraspinal: 10 units- 2 sites R paraspinal: 10 units- 2 sites  Face L frontalis(2 injection sites):10 units   R frontalis(2 injection sites):10 units         L corrugator: 5 units   R corrugator: 5 units           Procerus: 5 units   L temporalis: 20 units R temporalis: 20 units   Agent:  200 units of botulinum Type  A (Onobotulinum Toxin type A) was reconstituted with 4 ml of preservative free normal saline.  Time of reconstitution: At the time of the office visit (<30 minutes prior to injection)     Total injected (Units):  155  Total wasted (Units):  5  Patient tolerated procedure well without complications.   Reinjection is anticipated in 3 months.

## 2021-02-07 NOTE — Progress Notes (Signed)
Virtual Visit via Video Note The purpose of this virtual visit is to provide medical care while limiting exposure to the novel coronavirus.    Consent was obtained for video visit:  Yes.   Answered questions that patient had about telehealth interaction:  Yes.   I discussed the limitations, risks, security and privacy concerns of performing an evaluation and management service by telemedicine. I also discussed with the patient that there may be a patient responsible charge related to this service. The patient expressed understanding and agreed to proceed.  Pt location: Home Physician Location: office Name of referring provider:  Martinique, Betty G, MD I connected with Barbara Maldonado at patients initiation/request on 02/09/2021 at  8:30 AM EDT by video enabled telemedicine application and verified that I am speaking with the correct person using two identifiers. Pt MRN:  [id number] Pt DOB:  [birth year] Video Participants:  Barbara Maldonado  Assessment and Plan:   Chronic migraine without aura, without status migrainosus, not intractable - migraines improved on Botox but has adverse effects.  Migraine prevention:  Discontinue Botox.  Start Ajovy every 28 days Migraine rescue:  sumatriptan '100mg'$  with naproxen '500mg'$ .  Prescribed promethazine '25mg'$  for nausea Limit use of pain relievers to no more than 2 days out of week to prevent risk of rebound or medication-overuse headache. Keep headache diary Offered that she come in for a headache cocktail (assuming that she has a driver).  Declines for now.  She says headache is starting to let up. Follow up 6 months.   History of Present Illness:  Barbara Maldonado is a 55 year old right-handed woman who follows up for migraine.   UPDATE:   Stopped Aimovig.  Status post 2 rounds Botox, last round 12/22/2020.  Increased venlafaxine.  However, she would like to discontinue Botox because she still feels burning in her left trapezius muscle and also the right side  of her jaw feels tight and has difficulty opening her mouth.   Intensity: 6-7/10 Duration: Brief with sumatriptan and naproxen.  On a couple of occasions, needed to take second dose (2-3 hours).  However, she currently has a migraine that has been ongoing for 3 days.   Frequency: at least once a week    Rescue therapy:  Sumatriptan '100mg'$  with naproxen '500mg'$ ; sometimes may take Tylenol Migraine Current NSAIDS:  naproxen '550mg'$  (with sumatriptan), Tylenol Migraine  Current analgesics:  no Current triptans:  sumatriptan '100mg'$  (with naproxen) Current anti-emetic:  no Current muscle relaxants:  no Current anti-anxiolytic:  no Current sleep aide:  no Current Antihypertensive medications:  no Current Antidepressant medications:  venlafaxine XR '150mg'$  Current Anticonvulsant medications:  topiramate ER '100mg'$  Current anti-CGRP:  none Current Vitamins/Herbal/Supplements:  no Current Antihistamines/Decongestants:  Zyrtec Other therapy:  no   Caffeine:  cut down Alcohol:  no Smoker:  no Diet:  Good.  Drinks a lot of water Exercise:  yes Depression/anxiety:  No Sleep hygiene:  Good   HISTORY: Onset:  Childhood Location:  Left sided Quality:  Pounding, sharp Initial Intensity:  9-10; February: 6-7/10 Aura:  no Prodrome:  no Associated symptoms:  Nausea, photophobia, phonophobia, osmophobia, left ptosis.  Sometimes vomiting.  No unilateral numbness or weakness. Initial Duration:  1-2 days; February: brief with sumatriptan/naproxen Initial Frequency:  1 to 2 times a week.  However, she also has daily headache Triggers: emotional stress, weather Relieving factors:  Hot towel on head, quiet Activity:  Cannot function 2 times a month  Past NSAIDs/analgesics:  Excedrin, ibuprofen, Tylenol, Gabriel Earing  powder Past triptans:  sumatriptan [place], Maxalt, Zomig, Relpax Past antihypertensives:  Not appropriate as she has baseline low blood pressure Past antidepressants:  nortriptyline (increased appetite),  topiramate (effective but caused memory problems), zonisamide Past CGRP inhibitor:  Aimovig '140mg'$   Family history of headache:  mom  Past Medical History: Past Medical History:  Diagnosis Date   Anxiety    Basal cell carcinoma of skin 05/22/2017   Depression    Migraine    PONV (postoperative nausea and vomiting)     Medications: Outpatient Encounter Medications as of 02/09/2021  Medication Sig   cetirizine (ZYRTEC) 10 MG tablet Take 10 mg by mouth daily as needed.   SUMAtriptan-naproxen (TREXIMET) 85-500 MG tablet Take 1 tablet by mouth every 2 (two) hours as needed for migraine. Reported on 11/21/2015   venlafaxine XR (EFFEXOR-XR) 150 MG 24 hr capsule TAKE 1 CAPSULE BY MOUTH DAILY WITH BREAKFAST.   No facility-administered encounter medications on file as of 02/09/2021.    Allergies: No Known Allergies  Family History: Family History  Problem Relation Age of Onset   Multiple sclerosis Mother    Migraines Mother    Diabetes Mother    Hypertension Mother    Cancer Father        melanoma   Hypertension Sister    ADD / ADHD Son    ADD / ADHD Daughter    Cancer Paternal Grandmother        unknown    Observations/Objective:   There were no vitals taken for this visit. No acute distress.  Alert and oriented.  Speech fluent and not dysarthric.  Language intact.     Follow Up Instructions:    -I discussed the assessment and treatment plan with the patient. The patient was provided an opportunity to ask questions and all were answered. The patient agreed with the plan and demonstrated an understanding of the instructions.   The patient was advised to call back or seek an in-person evaluation if the symptoms worsen or if the condition fails to improve as anticipated.    Dudley Major, DO

## 2021-02-09 ENCOUNTER — Other Ambulatory Visit: Payer: Self-pay

## 2021-02-09 ENCOUNTER — Encounter: Payer: Self-pay | Admitting: Neurology

## 2021-02-09 ENCOUNTER — Telehealth (INDEPENDENT_AMBULATORY_CARE_PROVIDER_SITE_OTHER): Payer: 59 | Admitting: Neurology

## 2021-02-09 DIAGNOSIS — G43709 Chronic migraine without aura, not intractable, without status migrainosus: Secondary | ICD-10-CM

## 2021-02-09 MED ORDER — AJOVY 225 MG/1.5ML ~~LOC~~ SOAJ: 225.0000 mg | SUBCUTANEOUS | 5 refills | Status: DC

## 2021-02-09 MED ORDER — PROMETHAZINE HCL 25 MG PO TABS: 25.0000 mg | ORAL_TABLET | Freq: Four times a day (QID) | ORAL | 5 refills | Status: DC | PRN

## 2021-03-23 ENCOUNTER — Ambulatory Visit: Payer: 59 | Admitting: Neurology

## 2021-03-31 ENCOUNTER — Other Ambulatory Visit: Payer: Self-pay | Admitting: Neurology

## 2021-06-06 ENCOUNTER — Telehealth: Payer: Self-pay

## 2021-06-06 NOTE — Telephone Encounter (Signed)
New message   Barbara Maldonado (Key: [id number]) Aimovig 140MG /ML auto-injectors   Form [employer] Electronic PA Form [phone] NCPDP) Created 5 days ago Sent to Plan 16 minutes ago Plan Response 16 minutes ago Submit Clinical Questions 2 minutes ago Determination Wait for Determination Please wait for [employer] NCPDP 2017 to return a determination.   Your information has been submitted to [place]. To check for an updated outcome later, reopen this PA request from your dashboard.  If [employer] has not responded to your request within 24 hours, contact [place] at [phone]. If you think there may be a problem with your PA request, use our live chat feature at the bottom right.

## 2021-08-22 NOTE — Progress Notes (Signed)
? ?Due to the COVID-19 crisis, this telephone visit was done via telephone from my office and it was initiated and consent given by this patient and or family. ? ?Telephone (Audio) Visit ?Unable to connect for video visit.  The purpose of this telephone visit is to provide medical care while limiting exposure to the novel coronavirus.   ? ?Consent was obtained for telephone visit and initiated by pt/family:  Yes.   ?Answered questions that patient had about telehealth interaction:  Yes.   ?I discussed the limitations, risks, security and privacy concerns of performing an evaluation and management service by telephone. I also discussed with the patient that there may be a patient responsible charge related to this service. The patient expressed understanding and agreed to proceed. ? ?Pt location: Home ?Physician Location: office ?Name of referring provider:  Martinique, Betty G, MD ?I connected with .Darlin Coco at patients initiation/request on 08/23/2021 at  9:50 AM EST by telephone and verified that I am speaking with the correct person using two identifiers.  ?Pt MRN:  [id number] ?Pt DOB:  10-May-1966 ? ?Assessment/Plan:  ? ?Chronic migraine without aura, without status migrainosus, not intractable - migraines improved on Botox but has adverse effects. ?  ?Migraine prevention:  Start Qulipta '60mg'$  daily ?Migraine rescue:  sumatriptan '100mg'$  with naproxen '550mg'$ .  Prescribed promethazine '25mg'$  for nausea ?Limit use of pain relievers to no more than 2 days out of week to prevent risk of rebound or medication-overuse headache. ?Keep headache diary ?Offered that she come in for a headache cocktail (assuming that she has a driver).  Declines for now.  She says headache is starting to let up. ?Follow up 4 months. ?  ?  ?History of Present Illness:  ?Barbara Maldonado is a 56 year old right-handed woman who follows up for migraine. ?  ?UPDATE:   ?Last visit, discontinued Botox.  Started Ajovy.but it caused injection site reaction.    ?Intensity: 6-7/10 ?Duration: Brief with sumatriptan and naproxen.  On a couple of occasions, needed to take second dose (2-3 hours).  However, she currently has a migraine that has been ongoing for 3 days.   ?Frequency: at least two a week ?  ?  ?Rescue therapy:  Sumatriptan '100mg'$  with naproxen '500mg'$ ; sometimes may take Tylenol Migraine ?Current NSAIDS:  naproxen '550mg'$  (with sumatriptan), Tylenol Migraine  ?Current analgesics:  no ?Current triptans:  sumatriptan '100mg'$  (with naproxen) ?Current anti-emetic:  promethazine '25mg'$  ?Current muscle relaxants:  no ?Current anti-anxiolytic:  no ?Current sleep aide:  no ?Current Antihypertensive medications:  no ?Current Antidepressant medications:  venlafaxine XR '150mg'$  ?Current Anticonvulsant medications:  none ?Current anti-CGRP:  none ?Current Vitamins/Herbal/Supplements:  no ?Current Antihistamines/Decongestants:  Zyrtec ?Other therapy:  no ?  ?Caffeine:  cut down ?Alcohol:  no ?Smoker:  no ?Diet:  Good.  Drinks a lot of water ?Exercise:  yes ?Depression/anxiety:  No ?Sleep hygiene:  Good ?  ?HISTORY: ?Onset:  Childhood ?Location:  Left sided ?Quality:  Pounding, sharp ?Initial Intensity:  9-10; February: 6-7/10 ?Aura:  no ?Prodrome:  no ?Associated symptoms:  Nausea, photophobia, phonophobia, osmophobia, left ptosis.  Sometimes vomiting.  No unilateral numbness or weakness. ?Initial Duration:  1-2 days; February: brief with sumatriptan/naproxen ?Initial Frequency:  1 to 2 times a week.  However, she also has daily headache ?Triggers: emotional stress, weather ?Relieving factors:  Hot towel on head, quiet ?Activity:  Cannot function 2 times a month ?  ?Past NSAIDs/analgesics:  Excedrin, ibuprofen, Tylenol, Goody powder ?Past triptans:  sumatriptan , Maxalt, Zomig,  Relpax ?Past antihypertensives:  Not appropriate as she has baseline low blood pressure ?Past antidepressants:  nortriptyline (increased appetite) ?Past antiepileptic:  topiramate (effective but caused memory  problems), zonisamide, topiramate ER '100mg'$  ?Past CGRP inhibitor:  Aimovig '140mg'$ , Ajovy ?Other past treatment:  Botox (burning in left trapezius, right jaw tightness) ?  ?Family history of headache:  mom ? ?Need for in-office visit now?  No ?  ?Objective:  ? ?Vitals:  ? 08/23/21 0841  ?Weight: 150 lb (68 kg)  ?Height: '5\' 3"'$  (1.6 m)  ? ? ? ?Follow Up Instructions:  ? ?  ? -I discussed the assessment and treatment plan with the patient. The patient was provided an opportunity to ask questions and all were answered. The patient agreed with the plan and demonstrated an understanding of the instructions. ?  ?The patient was advised to call back or seek an in-person evaluation if the symptoms worsen or if the condition fails to improve as anticipated. ? ? ? ?Total Time spent in visit with the patient was:  13 minutes. ? ? ?Dudley Major, DO ?

## 2021-08-23 ENCOUNTER — Other Ambulatory Visit: Payer: Self-pay

## 2021-08-23 ENCOUNTER — Encounter: Payer: Self-pay | Admitting: Neurology

## 2021-08-23 ENCOUNTER — Telehealth (INDEPENDENT_AMBULATORY_CARE_PROVIDER_SITE_OTHER): Payer: 59 | Admitting: Neurology

## 2021-08-23 VITALS — Ht 63.0 in | Wt 150.0 lb

## 2021-08-23 DIAGNOSIS — G43709 Chronic migraine without aura, not intractable, without status migrainosus: Secondary | ICD-10-CM

## 2021-08-23 MED ORDER — NAPROXEN SODIUM 550 MG PO TABS: ORAL_TABLET | ORAL | 5 refills | Status: DC

## 2021-08-23 MED ORDER — QULIPTA 60 MG PO TABS: 60.0000 mg | ORAL_TABLET | Freq: Every day | ORAL | 5 refills | Status: DC

## 2021-08-23 NOTE — Patient Instructions (Signed)
Start Qulipta '60mg'$  daily.  Script sent [facility] order [facility] ?Sumatriptan with naproxen '550mg'$  for rescue ?

## 2021-10-07 ENCOUNTER — Other Ambulatory Visit: Payer: Self-pay | Admitting: Neurology

## 2021-11-07 ENCOUNTER — Telehealth: Payer: Self-pay | Admitting: Neurology

## 2021-11-07 ENCOUNTER — Telehealth (HOSPITAL_COMMUNITY): Payer: Self-pay | Admitting: Pharmacy Technician

## 2021-11-07 ENCOUNTER — Other Ambulatory Visit (HOSPITAL_COMMUNITY): Payer: Self-pay

## 2021-11-07 NOTE — Telephone Encounter (Signed)
Pt called in and left an access nurse. She stated her insurance is not covering her [place].

## 2021-11-07 NOTE — Telephone Encounter (Signed)
Patient Advocate Encounter   Received notification that prior authorization for Qulipta '60MG'$  tablets is required.   PA submitted on 11/07/2021 Key [id number] Status is pending       Lyndel Safe, [place] Patient Advocate Specialist [place] Patient Advocate Team Direct Number: [phone]  Fax: [phone]

## 2021-11-08 ENCOUNTER — Other Ambulatory Visit (HOSPITAL_COMMUNITY): Payer: Self-pay

## 2021-11-08 NOTE — Telephone Encounter (Signed)
Patient Advocate Encounter  Received notification that the request for prior authorization for Qulipta '60MG'$  tablets has been denied due to Coverage for this medication is denied for the following reason(s). We reviewed the information we received about your condition and circumstances. We used plan approved criteria when making this decision. The policy states that this medication may be approved when: -The member is unable to take the required number of formulary alternatives for the given diagnosis due to an intolerance or contraindication OR -The member has tried and failed the required number of formulary alternatives. Based on the policy and the information we received your request is denied. We did not receive documentation that you meet the criteria outlined above. Formulary alternatives are: Nurtec ODT, Ajovy, Emgality.   Requirement: 3 in a class with 3 or more alternatives       Lyndel Safe, CPhT Pharmacy Patient Advocate Specialist [place] Patient Advocate Team Direct Number: [phone]  Fax: [phone]

## 2021-11-13 ENCOUNTER — Telehealth (HOSPITAL_COMMUNITY): Payer: Self-pay | Admitting: Pharmacy Technician

## 2021-11-13 MED ORDER — EMGALITY 120 MG/ML ~~LOC~~ SOAJ: 240.0000 mg | Freq: Once | SUBCUTANEOUS | 0 refills | Status: AC

## 2021-11-13 MED ORDER — EMGALITY 120 MG/ML ~~LOC~~ SOAJ: 120.0000 mg | SUBCUTANEOUS | 5 refills | Status: DC

## 2021-11-13 NOTE — Telephone Encounter (Signed)
Patient Advocate Encounter   Received notification from that prior authorization for Emgality '120MG'$ /ML auto-injectors (migraine) is required.   PA submitted on 11/13/2021 Key [id number] Status is pending       Lyndel Safe, [place] Patient Advocate Specialist [place] Patient Advocate Team Direct Number: [phone]  Fax: [phone]

## 2021-11-13 NOTE — Telephone Encounter (Signed)
Patient advised,  Unfortunately, her insurance is telling us we must try 2 additional medications (Emgality and Nurtec).   I patient agreeable, would start Emgality. If she is agreeable, please send in 2 prescriptions (let her know that first dose is a loading dose requiring 2 injections but then just 1 injection every 28 days thereafter like the other injections):  1) Emgality '240mg'$  once.  Loading dose.  Quantity 2 mL, refills 0  2) Emgality '120mg'$  every 28 days.  Quantity 1 mL, refills 5.  Script sent. Patient advised.

## 2021-11-14 NOTE — Telephone Encounter (Signed)
Patient Advocate Encounter  Prior Authorization for Emgality '120MG'$ /ML auto-injectors (migraine) has been approved.    PA# [id number] Effective dates: 11/13/2021 through 11/14/2022      Lyndel Safe, [place] Patient Advocate Specialist [place] Patient Advocate Team Direct Number: [phone]  Fax: [phone]

## 2021-11-16 ENCOUNTER — Telehealth: Payer: Self-pay | Admitting: Neurology

## 2021-11-16 NOTE — Telephone Encounter (Signed)
Henrine Screws the message.

## 2021-11-16 NOTE — Telephone Encounter (Signed)
Pt downloaded the Copay card. Will check with insurance and let us know how much.  If too expensive still try lilly Cares.

## 2021-11-16 NOTE — Telephone Encounter (Signed)
Taralyn called back to let sheena know the savings card did work. Doesn't need a call back

## 2021-11-16 NOTE — Telephone Encounter (Signed)
Patient states that the emgality is over 300.00 dollars and she is not going to pay for that   [facility] [address]  in [place]  please call patient '  She did state something about maybe a letter will help

## 2021-12-31 ENCOUNTER — Other Ambulatory Visit: Payer: Self-pay | Admitting: Neurology

## 2022-01-02 ENCOUNTER — Other Ambulatory Visit: Payer: Self-pay | Admitting: Neurology

## 2022-01-08 ENCOUNTER — Other Ambulatory Visit: Payer: Self-pay

## 2022-01-08 MED ORDER — EMGALITY 120 MG/ML ~~LOC~~ SOAJ: SUBCUTANEOUS | 0 refills | Status: DC

## 2022-02-11 NOTE — Progress Notes (Unsigned)
NEUROLOGY FOLLOW UP OFFICE NOTE  TRENTON VERNE [id number]  Assessment/Plan:   Migraine without aura, without status migrainosus, not intractable - migraines improved on Botox but has adverse effects.   Migraine prevention:  Emgality ineffective.  Start Qulipta '60mg'$  daily - previously did well with Qulipta and has failed Aimovig, Ajovy, Emgality, topiramate, nortriptyline and cannot take beta blocker due to history of low blood pressure. Migraine rescue:  sumatriptan '100mg'$  with naproxen '550mg'$ .  Prescribed promethazine '25mg'$  for nausea Limit use of pain relievers to no more than 2 days out of week to prevent risk of rebound or medication-overuse headache. Keep headache diary Offered that she come in for a headache cocktail (assuming that she has a driver).  Declines for now.  She says headache is starting to let up. Follow up 4 months.  Subjective:  Barbara Maldonado is a 56 year old right-handed woman who follows up for migraine.   UPDATE:   Insurance wouldn't cover [employer].  Prescribed Emgality Intensity: 6-7/10 Duration: Brief with sumatriptan and naproxen.  On a couple of occasions, needed to take second dose (2-3 hours).  However, she currently has a migraine that has been ongoing for 3 days.   Frequency: 2-3 a week     Rescue therapy:  Sumatriptan '100mg'$  with naproxen '500mg'$ ; sometimes may take Tylenol Migraine Current NSAIDS:  naproxen '550mg'$  (with sumatriptan), Tylenol Migraine  Current analgesics:  no Current triptans:  sumatriptan '100mg'$  (with naproxen) Current anti-emetic:  promethazine '25mg'$  Current muscle relaxants:  no Current anti-anxiolytic:  no Current sleep aide:  no Current Antihypertensive medications:  no Current Antidepressant medications:  venlafaxine XR '150mg'$  Current Anticonvulsant medications:  none Current anti-CGRP:  none Current Vitamins/Herbal/Supplements:  no Current Antihistamines/Decongestants:  Zyrtec Other therapy:  no   Caffeine:  cut down Alcohol:   no Smoker:  no Diet:  Good.  Drinks a lot of water Exercise:  yes Depression/anxiety:  No Sleep hygiene:  Good   HISTORY: Onset:  Childhood Location:  Left sided Quality:  Pounding, sharp Initial Intensity:  9-10; February: 6-7/10 Aura:  no Prodrome:  no Associated symptoms:  Nausea, photophobia, phonophobia, osmophobia, left ptosis.  Sometimes vomiting.  No unilateral numbness or weakness. Initial Duration:  1-2 days; February: brief with sumatriptan/naproxen Initial Frequency:  1 to 2 times a week.  However, she also has daily headache Triggers: emotional stress, weather Relieving factors:  Hot towel on head, quiet Activity:  Cannot function 2 times a month   Past NSAIDs/analgesics:  Excedrin, ibuprofen, Tylenol, Goody powder Past triptans:  sumatriptan [place], Maxalt, Zomig, Relpax Past antihypertensives:  Not appropriate as she has baseline low blood pressure Past antidepressants:  nortriptyline (increased appetite) Past antiepileptic:  topiramate (effective but caused memory problems), zonisamide, topiramate ER '100mg'$  Past CGRP inhibitor:  Aimovig '140mg'$ , Ajovy Other past treatment:  Botox (burning in left trapezius, right jaw tightness)   Family history of headache:  mom  PAST MEDICAL HISTORY: Past Medical History:  Diagnosis Date   Anxiety    Basal cell carcinoma of skin 05/22/2017   Depression    Migraine    PONV (postoperative nausea and vomiting)     MEDICATIONS: Current Outpatient Medications on File Prior to Visit  Medication Sig Dispense Refill   Atogepant (QULIPTA) 60 MG TABS Take 60 mg by mouth daily. 30 tablet 5   cetirizine (ZYRTEC) 10 MG tablet Take 10 mg by mouth daily as needed.     Fremanezumab-vfrm (AJOVY) 225 MG/1.5ML SOAJ Inject 225 mg into the skin every 28 (twenty-eight)  days. (Patient not taking: Reported on 08/23/2021) 1.68 mL 5   Galcanezumab-gnlm (EMGALITY) 120 MG/ML SOAJ INJECT 240 MG INTO THE SKIN ONCE FOR 1 DOSE. LOADING DOSE 2 mL 0   naproxen  sodium (ANAPROX) 550 MG tablet TAKE 1 TABLET BY MOUTH WITH EACH DOSE OF SUMATRIPTAN 10 tablet 5   promethazine (PHENERGAN) 25 MG tablet Take 1 tablet (25 mg total) by mouth every 6 (six) hours as needed for nausea or vomiting. 30 tablet 5   venlafaxine XR (EFFEXOR-XR) 150 MG 24 hr capsule TAKE 1 CAPSULE BY MOUTH DAILY WITH BREAKFAST. 90 capsule 1   No current facility-administered medications on file prior to visit.    ALLERGIES: No Known Allergies  FAMILY HISTORY: Family History  Problem Relation Age of Onset   Multiple sclerosis Mother    Migraines Mother    Diabetes Mother    Hypertension Mother    Cancer Father        melanoma   Hypertension Sister    ADD / ADHD Son    ADD / ADHD Daughter    Cancer Paternal Grandmother        unknown      Objective:  Blood pressure 119/81, pulse 93, height '5\' 3"'$  (1.6 m), weight 157 lb 12.8 oz (71.6 kg), SpO2 99 %. General: No acute distress.  Patient appears well-groomed.   Head:  Normocephalic/atraumatic Eyes:  Fundi examined but not visualized Neck: supple, no paraspinal tenderness, full range of motion Heart:  Regular rate and rhythm Neurological Exam: alert and oriented to person, place, and time.  Speech fluent and not dysarthric, language intact.  CN II-XII intact. Bulk and tone normal, muscle strength 5/5 throughout.  Sensation to light touch intact.  Deep tendon reflexes 2+ throughout.  Finger to nose testing intact.  Gait normal, Romberg negative.   Metta Clines, DO  CC: Betty Martinique, MD

## 2022-02-12 ENCOUNTER — Encounter: Payer: Self-pay | Admitting: Neurology

## 2022-02-12 ENCOUNTER — Ambulatory Visit: Payer: 59 | Admitting: Neurology

## 2022-02-12 VITALS — BP 119/81 | HR 93 | Ht 63.0 in | Wt 157.8 lb

## 2022-02-12 DIAGNOSIS — G43009 Migraine without aura, not intractable, without status migrainosus: Secondary | ICD-10-CM | POA: Diagnosis not present

## 2022-02-12 MED ORDER — QULIPTA 60 MG PO TABS: 60.0000 mg | ORAL_TABLET | Freq: Every day | ORAL | 5 refills | Status: DC

## 2022-02-12 MED ORDER — PROMETHAZINE HCL 25 MG PO TABS
25.0000 mg | ORAL_TABLET | Freq: Four times a day (QID) | ORAL | 5 refills | Status: DC | PRN
Start: 2022-02-12 — End: 2022-07-08

## 2022-02-12 MED ORDER — VENLAFAXINE HCL ER 150 MG PO CP24: 150.0000 mg | ORAL_CAPSULE | Freq: Every day | ORAL | 1 refills | Status: DC

## 2022-02-12 NOTE — Patient Instructions (Signed)
Stop Emgality.  Will try to prescribe Qulipta again (Sent to [facility]).

## 2022-04-17 ENCOUNTER — Telehealth: Payer: Self-pay | Admitting: Anesthesiology

## 2022-04-17 MED ORDER — QULIPTA 60 MG PO TABS: 60.0000 mg | ORAL_TABLET | Freq: Every day | ORAL | 3 refills | Status: DC

## 2022-04-17 NOTE — Telephone Encounter (Signed)
Patient called requesting a refill on her Qulipta. She received a letter from her pharmacy saying [facility]   1. Which medications need refilled? Qulipta 60 mg   2. Which pharmacy/location [facility] [address], [place] [address]  3. Do they need a 30 day or 90 day supply? Pt did not say

## 2022-07-04 NOTE — Progress Notes (Signed)
NEUROLOGY FOLLOW UP OFFICE NOTE  Barbara Maldonado [id number]  Assessment/Plan:   Migraine without aura, without status migrainosus, not intractable - stable   Migraine prevention:  Qulipta '60mg'$  daily  Migraine rescue:  sumatriptan '100mg'$  with naproxen '550mg'$ .  Prescribed promethazine '25mg'$  for nausea Limit use of pain relievers to no more than 2 days out of week to prevent risk of rebound or medication-overuse headache. Keep headache diary Follow up with PCP regarding systemic symptoms.   Follow up with me in 6 months.  Subjective:  Barbara Maldonado is a 57 year old right-handed woman who follows up for migraine.   UPDATE:   Intensity: 6-7/10 Duration: Brief with sumatriptan and naproxen.  On a couple of occasions, needed to take second dose (2-3 hours).  However, she currently has a migraine that has been ongoing for 3 days.   Frequency: 2 a month  For the past 1-2 months, she has been feeling "off".  Her head "doesn't feel right", like she is lightheaded.  She also feels nauseous and exhausted.  Laying down helps.  No headache, visual disturbance, upper or lower respiratory symptoms or myalgias.  Denies being sick.     Rescue therapy:  Sumatriptan '100mg'$  with naproxen '500mg'$ ; sometimes may take Tylenol Migraine Current NSAIDS:  naproxen '550mg'$  (with sumatriptan), Tylenol Migraine  Current analgesics:  no Current triptans:  sumatriptan '100mg'$  (with naproxen) Current anti-emetic:  promethazine '25mg'$  Current muscle relaxants:  no Current anti-anxiolytic:  no Current sleep aide:  no Current Antihypertensive medications:  no Current Antidepressant medications:  venlafaxine XR '150mg'$  Current Anticonvulsant medications:  none Current anti-CGRP:  Qulipta '60mg'$  daily Current Vitamins/Herbal/Supplements:  no Current Antihistamines/Decongestants:  Zyrtec Other therapy:  no   Caffeine:  cut down Alcohol:  no Smoker:  no Diet:  Good.  Drinks a lot of water Exercise:  yes Depression/anxiety:   No Sleep hygiene:  Good   HISTORY: Onset:  Childhood Location:  Left sided Quality:  Pounding, sharp Initial Intensity:  9-10; February: 6-7/10 Aura:  no Prodrome:  no Associated symptoms:  Nausea, photophobia, phonophobia, osmophobia, left ptosis.  Sometimes vomiting.  No unilateral numbness or weakness. Initial Duration:  1-2 days; February: brief with sumatriptan/naproxen Initial Frequency:  1 to 2 times a week.  However, she also has daily headache Triggers: emotional stress, weather Relieving factors:  Hot towel on head, quiet Activity:  Cannot function 2 times a month   Past NSAIDs/analgesics:  Excedrin, ibuprofen, Tylenol, Goody powder Past triptans:  sumatriptan [place], Maxalt, Zomig, Relpax Past antihypertensives:  Not appropriate as she has baseline low blood pressure Past antidepressants:  nortriptyline (increased appetite) Past antiepileptic:  topiramate (effective but caused memory problems), zonisamide, topiramate ER '100mg'$  Past CGRP inhibitor:  Aimovig '140mg'$ , Ajovy, Emgality Other past treatment:  Botox (burning in left trapezius, right jaw tightness)   Family history of headache:  mom  PAST MEDICAL HISTORY: Past Medical History:  Diagnosis Date   Anxiety    Basal cell carcinoma of skin 05/22/2017   Depression    Migraine    PONV (postoperative nausea and vomiting)     MEDICATIONS: Current Outpatient Medications on File Prior to Visit  Medication Sig Dispense Refill   Atogepant (QULIPTA) 60 MG TABS Take 60 mg by mouth daily. 30 tablet 3   cetirizine (ZYRTEC) 10 MG tablet Take 10 mg by mouth daily as needed.     naproxen sodium (ANAPROX) 550 MG tablet TAKE 1 TABLET BY MOUTH WITH EACH DOSE OF SUMATRIPTAN 10 tablet 5   promethazine (  PHENERGAN) 25 MG tablet Take 1 tablet (25 mg total) by mouth every 6 (six) hours as needed for nausea or vomiting. 30 tablet 5   venlafaxine XR (EFFEXOR-XR) 150 MG 24 hr capsule Take 1 capsule (150 mg total) by mouth daily with breakfast.  90 capsule 1   No current facility-administered medications on file prior to visit.    ALLERGIES: No Known Allergies  FAMILY HISTORY: Family History  Problem Relation Age of Onset   Multiple sclerosis Mother    Migraines Mother    Diabetes Mother    Hypertension Mother    Cancer Father        melanoma   Hypertension Sister    ADD / ADHD Son    ADD / ADHD Daughter    Cancer Paternal Grandmother        unknown      Objective:  Blood pressure 135/83, pulse 73, height '5\' 3"'$  (1.6 m), weight 155 lb (70.3 kg), SpO2 100 %. General: No acute distress.  Patient appears well-groomed.   Head:  Normocephalic/atraumatic Eyes:  Fundi examined but not visualized Neck: supple, no paraspinal tenderness, full range of motion Heart:  Regular rate and rhythm Neurological Exam: alert and oriented to person, place, and time.  Speech fluent and not dysarthric, language intact.  CN II-XII intact. Bulk and tone normal, muscle strength 5/5 throughout.  Sensation to light touch intact.  Deep tendon reflexes 2+ throughout.  Finger to nose testing intact.  Gait normal, able to tandem walk.  Romberg negative.   Metta Clines, DO  CC: Betty [place], MD

## 2022-07-08 ENCOUNTER — Encounter: Payer: Self-pay | Admitting: Neurology

## 2022-07-08 ENCOUNTER — Ambulatory Visit: Payer: 59 | Admitting: Neurology

## 2022-07-08 VITALS — BP 135/83 | HR 73 | Ht 63.0 in | Wt 155.0 lb

## 2022-07-08 DIAGNOSIS — G43009 Migraine without aura, not intractable, without status migrainosus: Secondary | ICD-10-CM | POA: Diagnosis not present

## 2022-07-08 MED ORDER — VENLAFAXINE HCL ER 150 MG PO CP24: 150.0000 mg | ORAL_CAPSULE | Freq: Every day | ORAL | 3 refills | Status: AC

## 2022-07-08 MED ORDER — NAPROXEN SODIUM 550 MG PO TABS: ORAL_TABLET | ORAL | 5 refills | Status: AC

## 2022-07-08 MED ORDER — QULIPTA 60 MG PO TABS: 60.0000 mg | ORAL_TABLET | Freq: Every day | ORAL | 3 refills | Status: AC

## 2022-07-08 MED ORDER — PROMETHAZINE HCL 25 MG PO TABS: 25.0000 mg | ORAL_TABLET | Freq: Four times a day (QID) | ORAL | 5 refills | Status: AC | PRN

## 2023-01-06 NOTE — Progress Notes (Deleted)
NEUROLOGY FOLLOW UP OFFICE NOTE  Barbara Maldonado [id number]  Assessment/Plan:   Migraine without aura, without status migrainosus, not intractable - stable   Migraine prevention:  Qulipta 60mg  daily  Migraine rescue:  sumatriptan 100mg  with naproxen 550mg .  Prescribed promethazine 25mg  for nausea Limit use of pain relievers to no more than 2 days out of week to prevent risk of rebound or medication-overuse headache. Keep headache diary Follow up ***  Subjective:  Barbara Maldonado is a 57 year old right-handed woman who follows up for migraine.   UPDATE:   Intensity: 6-7/10 Duration: Brief with sumatriptan and naproxen.  *** Frequency: 2 a month ***  Rescue therapy:  Sumatriptan 100mg  with naproxen 500mg ; sometimes may take Tylenol Migraine Current NSAIDS:  naproxen 550mg  (with sumatriptan), Tylenol Migraine  Current analgesics:  no Current triptans:  sumatriptan 100mg  (with naproxen) Current anti-emetic:  promethazine 25mg  Current muscle relaxants:  no Current anti-anxiolytic:  no Current sleep aide:  no Current Antihypertensive medications:  no Current Antidepressant medications:  venlafaxine XR 150mg  Current Anticonvulsant medications:  none Current anti-CGRP:  Qulipta 60mg  daily Current Vitamins/Herbal/Supplements:  no Current Antihistamines/Decongestants:  Zyrtec Other therapy:  no   Caffeine:  cut down Alcohol:  no Smoker:  no Diet:  Good.  Drinks a lot of water Exercise:  yes Depression/anxiety:  No Sleep hygiene:  Good   HISTORY: Onset:  Childhood Location:  Left sided Quality:  Pounding, sharp Initial Intensity:  9-10; February: 6-7/10 Aura:  no Prodrome:  no Associated symptoms:  Nausea, photophobia, phonophobia, osmophobia, left ptosis.  Sometimes vomiting.  No unilateral numbness or weakness. Initial Duration:  1-2 days; February: brief with sumatriptan/naproxen Initial Frequency:  1 to 2 times a week.  However, she also has daily headache Triggers:  emotional stress, weather Relieving factors:  Hot towel on head, quiet Activity:  Cannot function 2 times a month   Past NSAIDs/analgesics:  Excedrin, ibuprofen, Tylenol, Goody powder Past triptans:  sumatriptan [place], Maxalt, Zomig, Relpax Past antihypertensives:  Not appropriate as she has baseline low blood pressure Past antidepressants:  nortriptyline (increased appetite) Past antiepileptic:  topiramate (effective but caused memory problems), zonisamide, topiramate ER 100mg  Past CGRP inhibitor:  Aimovig 140mg , Ajovy, Emgality Other past treatment:  Botox (burning in left trapezius, right jaw tightness)   Family history of headache:  mom  PAST MEDICAL HISTORY: Past Medical History:  Diagnosis Date   Anxiety    Basal cell carcinoma of skin 05/22/2017   Depression    Migraine    PONV (postoperative nausea and vomiting)     MEDICATIONS: Current Outpatient Medications on File Prior to Visit  Medication Sig Dispense Refill   Atogepant (QULIPTA) 60 MG TABS Take 1 tablet (60 mg total) by mouth daily. 90 tablet 3   cetirizine (ZYRTEC) 10 MG tablet Take 10 mg by mouth daily as needed.     estradiol (ESTRACE) 1 MG tablet Take 1 mg by mouth daily.     naproxen sodium (ANAPROX) 550 MG tablet TAKE 1 TABLET BY MOUTH WITH EACH DOSE OF SUMATRIPTAN 10 tablet 5   promethazine (PHENERGAN) 25 MG tablet Take 1 tablet (25 mg total) by mouth every 6 (six) hours as needed for nausea or vomiting. 30 tablet 5   venlafaxine XR (EFFEXOR-XR) 150 MG 24 hr capsule Take 1 capsule (150 mg total) by mouth daily with breakfast. 90 capsule 3   No current facility-administered medications on file prior to visit.    ALLERGIES: No Known Allergies  FAMILY HISTORY: Family History  Problem Relation Age of Onset   Multiple sclerosis Mother    Migraines Mother    Diabetes Mother    Hypertension Mother    Cancer Father        melanoma   Hypertension Sister    ADD / ADHD Son    ADD / ADHD Daughter    Cancer  Paternal Grandmother        unknown      Objective:  *** General: No acute distress.  Patient appears well-groomed.   Head:  Normocephalic/atraumatic Eyes:  Fundi examined but not visualized Neck: supple, no paraspinal tenderness, full range of motion Heart:  Regular rate and rhythm Neurological Exam: alert ***   Shon Millet, DO  CC: Betty [place], MD

## 2023-01-07 ENCOUNTER — Ambulatory Visit: Payer: 59 | Admitting: Neurology

## 2023-01-07 ENCOUNTER — Encounter: Payer: Self-pay | Admitting: Neurology

## 2023-01-07 DIAGNOSIS — Z029 Encounter for administrative examinations, unspecified: Secondary | ICD-10-CM

## 2023-02-05 LAB — COLOGUARD

## 2023-02-28 LAB — COLOGUARD: COLOGUARD: NEGATIVE

## 2023-07-07 ENCOUNTER — Other Ambulatory Visit: Payer: Self-pay | Admitting: Neurology

## 2024-04-12 ENCOUNTER — Other Ambulatory Visit: Payer: Self-pay | Admitting: Neurology

## 2024-04-20 ENCOUNTER — Telehealth: Payer: Self-pay | Admitting: Neurology

## 2024-04-20 NOTE — Telephone Encounter (Signed)
 Who's calling (name and relationship to patient) : Barbara Maldonado: Self   Best contact number: [phone]  Provider they see: Dr.Jaffe  Reason for call: Barbara wanted to know if she would be able to get Rx refill for phenergan , naproxen  550 mg , and sumatriptan (Out of these 3). Will be out of Effexor  by the time [name] gets here. [name] is requesting a call back.   FYIValori Hollenkamp is scheduled for 07/23/24 and added to wait list.

## 2024-04-21 NOTE — Telephone Encounter (Signed)
 LMOVM for patient to call the office back, I do not feel comfortable refilling these medications as I have not seen her in almost 2 years. The last time I filled her medication was in January 2024 with additional refills. If she needs a refill until her follow up, I would recommend asking the provider who has refilled her medications since she last saw me.

## 2024-06-09 NOTE — Progress Notes (Deleted)
 "  NEUROLOGY FOLLOW UP OFFICE NOTE  Barbara Maldonado [id number]  Assessment/Plan:   Migraine without aura, without status migrainosus, not intractable - stable    Migraine prevention:  Qulipta  60mg  daily Migraine rescue:  sumatriptan  100mg  with naproxen  550mg .  Prescribed promethazine  25mg  for nausea Lifestyle modification: Limit use of pain relievers to no more than 9 days out of the month to prevent risk of rebound or medication-overuse headache. Diet modification/hydration/caffeine cessation Routine exercise Sleep hygiene Consider vitamins/supplements:  magnesium citrate 400mg  daily, riboflavin 400mg  daily, CoQ10 100mg  three times daily Keep headache diary Follow up ***   Subjective:  Barbara Maldonado is a 58 year old right-handed woman who follows up for migraine.   UPDATE:   Last seen in January 2024. ***  Intensity: 6-7/10 Duration: Brief with sumatriptan  and naproxen .  Frequency: 2 a month  Rescue therapy:  Sumatriptan  100mg  with naproxen  500mg ; sometimes may take Tylenol  Migraine Current NSAIDS:  naproxen  550mg  (with sumatriptan ), Tylenol  Migraine  Current analgesics:  no Current triptans:  sumatriptan  100mg  (with naproxen ) Current anti-emetic:  promethazine  25mg  Current muscle relaxants:  no Current anti-anxiolytic:  no Current sleep aide:  no Current Antihypertensive medications:  no Current Antidepressant medications:  venlafaxine  XR 150mg  Current Anticonvulsant medications:  none Current anti-CGRP:  Qulipta  60mg  daily Current Vitamins/Herbal/Supplements:  no Current Antihistamines/Decongestants:  Zyrtec Other therapy:  no   Caffeine:  cut down Alcohol:  no Smoker:  no Diet:  Good.  Drinks a lot of water Exercise:  yes Depression/anxiety:  No Sleep hygiene:  Good   HISTORY: Onset:  Childhood Location:  Left sided Quality:  Pounding, sharp Initial Intensity:  9-10; February: 6-7/10 Aura:  no Prodrome:  no Associated symptoms:  Nausea, photophobia,  phonophobia, osmophobia, left ptosis.  Sometimes vomiting.  No unilateral numbness or weakness. Initial Duration:  1-2 days; February: brief with sumatriptan /naproxen  Initial Frequency:  1 to 2 times a week.  However, she also has daily headache Triggers: emotional stress, weather Relieving factors:  Hot towel on head, quiet Activity:  Cannot function 2 times a month   Past NSAIDs/analgesics:  Excedrin, ibuprofen, Tylenol , Goody powder Past triptans:  sumatriptan  , Maxalt, Zomig, Relpax Past antihypertensives:  Not appropriate as she has baseline low blood pressure Past antidepressants:  nortriptyline  (increased appetite) Past antiepileptic:  topiramate  (effective but caused memory problems), zonisamide , topiramate  ER 100mg  Past CGRP inhibitor:  Aimovig  140mg , Ajovy , Emgality  Other past treatment:  Botox  (burning in left trapezius, right jaw tightness)   Family history of headache:  mom  PAST MEDICAL HISTORY: Past Medical History:  Diagnosis Date   Anxiety    Basal cell carcinoma of skin 05/22/2017   Depression    Migraine    PONV (postoperative nausea and vomiting)     MEDICATIONS: Current Outpatient Medications on File Prior to Visit  Medication Sig Dispense Refill   Atogepant  (QULIPTA ) 60 MG TABS Take 1 tablet (60 mg total) by mouth daily. 90 tablet 3   cetirizine (ZYRTEC) 10 MG tablet Take 10 mg by mouth daily as needed.     estradiol (ESTRACE) 1 MG tablet Take 1 mg by mouth daily.     naproxen  sodium (ANAPROX ) 550 MG tablet TAKE 1 TABLET BY MOUTH WITH EACH DOSE OF SUMATRIPTAN  10 tablet 5   promethazine  (PHENERGAN ) 25 MG tablet Take 1 tablet (25 mg total) by mouth every 6 (six) hours as needed for nausea or vomiting. 30 tablet 5   venlafaxine  XR (EFFEXOR -XR) 150 MG 24 hr capsule Take 1 capsule (150 mg  total) by mouth daily with breakfast. 90 capsule 3   No current facility-administered medications on file prior to visit.    ALLERGIES: No Known Allergies  FAMILY  HISTORY: Family History  Problem Relation Age of Onset   Multiple sclerosis Mother    Migraines Mother    Diabetes Mother    Hypertension Mother    Cancer Father        melanoma   Hypertension Sister    ADD / ADHD Son    ADD / ADHD Daughter    Cancer Paternal Grandmother        unknown      Objective:  *** General: No acute distress.  Patient appears well-groomed.   Head:  Normocephalic/atraumatic Neck:  Supple.  No paraspinal tenderness.  Full range of motion. Heart:  Regular rate and rhythm. Neuro:  Alert and oriented.  Speech fluent and not dysarthric.  Language intact.  CN II-XII intact.  Bulk and tone normal.  Muscle strength 5/5 throughout.  Sensation to light touch intact.  Deep tendon reflexes 2+ throughout, toes downgoing.  Gait normal.  Romberg negative.    Barbara Dunnings, DO  CC: Betty Jordan, MD      "

## 2024-06-11 ENCOUNTER — Ambulatory Visit: Admitting: Neurology

## 2024-06-21 NOTE — Progress Notes (Deleted)
 "  NEUROLOGY FOLLOW UP OFFICE NOTE  Barbara Maldonado [id number]  Assessment/Plan:   Migraine without aura, without status migrainosus, not intractable - stable    Migraine prevention:  Qulipta  60mg  daily Migraine rescue:  sumatriptan  100mg  with naproxen  550mg .  Prescribed promethazine  25mg  for nausea Lifestyle modification: Limit use of pain relievers to no more than 9 days out of the month to prevent risk of rebound or medication-overuse headache. Diet modification/hydration/caffeine cessation Routine exercise Sleep hygiene Consider vitamins/supplements:  magnesium citrate 400mg  daily, riboflavin 400mg  daily, CoQ10 100mg  three times daily Keep headache diary Follow up ***   Subjective:  Barbara Maldonado is a 59 year old right-handed woman who follows up for migraine.   UPDATE:   Last seen in January 2024. ***  Intensity: 6-7/10 Duration: Brief with sumatriptan  and naproxen .  Frequency: 2 a month  Rescue therapy:  Sumatriptan  100mg  with naproxen  500mg ; sometimes may take Tylenol  Migraine Current NSAIDS:  naproxen  550mg  (with sumatriptan ), Tylenol  Migraine  Current analgesics:  no Current triptans:  sumatriptan  100mg  (with naproxen ) Current anti-emetic:  promethazine  25mg  Current muscle relaxants:  no Current anti-anxiolytic:  no Current sleep aide:  no Current Antihypertensive medications:  no Current Antidepressant medications:  venlafaxine  XR 150mg  Current Anticonvulsant medications:  none Current anti-CGRP:  Qulipta  60mg  daily Current Vitamins/Herbal/Supplements:  no Current Antihistamines/Decongestants:  Zyrtec Other therapy:  no   Caffeine:  cut down Alcohol:  no Smoker:  no Diet:  Good.  Drinks a lot of water Exercise:  yes Depression/anxiety:  No Sleep hygiene:  Good   HISTORY: Onset:  Childhood Location:  Left sided Quality:  Pounding, sharp Initial Intensity:  9-10; February: 6-7/10 Aura:  no Prodrome:  no Associated symptoms:  Nausea, photophobia,  phonophobia, osmophobia, left ptosis.  Sometimes vomiting.  No unilateral numbness or weakness. Initial Duration:  1-2 days; February: brief with sumatriptan /naproxen  Initial Frequency:  1 to 2 times a week.  However, she also has daily headache Triggers: emotional stress, weather Relieving factors:  Hot towel on head, quiet Activity:  Cannot function 2 times a month   Past NSAIDs/analgesics:  Excedrin, ibuprofen, Tylenol , Goody powder Past triptans:  sumatriptan  , Maxalt, Zomig, Relpax Past antihypertensives:  Not appropriate as she has baseline low blood pressure Past antidepressants:  nortriptyline  (increased appetite) Past antiepileptic:  topiramate  (effective but caused memory problems), zonisamide , topiramate  ER 100mg  Past CGRP inhibitor:  Aimovig  140mg , Ajovy , Emgality  Other past treatment:  Botox  (burning in left trapezius, right jaw tightness)   Family history of headache:  mom  PAST MEDICAL HISTORY: Past Medical History:  Diagnosis Date   Anxiety    Basal cell carcinoma of skin 05/22/2017   Depression    Migraine    PONV (postoperative nausea and vomiting)     MEDICATIONS: Current Outpatient Medications on File Prior to Visit  Medication Sig Dispense Refill   Atogepant  (QULIPTA ) 60 MG TABS Take 1 tablet (60 mg total) by mouth daily. 90 tablet 3   cetirizine (ZYRTEC) 10 MG tablet Take 10 mg by mouth daily as needed.     estradiol (ESTRACE) 1 MG tablet Take 1 mg by mouth daily.     naproxen  sodium (ANAPROX ) 550 MG tablet TAKE 1 TABLET BY MOUTH WITH EACH DOSE OF SUMATRIPTAN  10 tablet 5   promethazine  (PHENERGAN ) 25 MG tablet Take 1 tablet (25 mg total) by mouth every 6 (six) hours as needed for nausea or vomiting. 30 tablet 5   venlafaxine  XR (EFFEXOR -XR) 150 MG 24 hr capsule Take 1 capsule (150 mg  total) by mouth daily with breakfast. 90 capsule 3   No current facility-administered medications on file prior to visit.    ALLERGIES: No Known Allergies  FAMILY  HISTORY: Family History  Problem Relation Age of Onset   Multiple sclerosis Mother    Migraines Mother    Diabetes Mother    Hypertension Mother    Cancer Father        melanoma   Hypertension Sister    ADD / ADHD Son    ADD / ADHD Daughter    Cancer Paternal Grandmother        unknown      Objective:  *** General: No acute distress.  Patient appears well-groomed.   Head:  Normocephalic/atraumatic Neck:  Supple.  No paraspinal tenderness.  Full range of motion. Heart:  Regular rate and rhythm. Neuro:  Alert and oriented.  Speech fluent and not dysarthric.  Language intact.  CN II-XII intact.  Bulk and tone normal.  Muscle strength 5/5 throughout.  Sensation to light touch intact.  Deep tendon reflexes 2+ throughout, toes downgoing.  Gait normal.  Romberg negative.    Barbara Dunnings, DO  CC: Betty Jordan, MD      "

## 2024-06-22 ENCOUNTER — Ambulatory Visit: Admitting: Neurology

## 2024-07-23 ENCOUNTER — Ambulatory Visit: Admitting: Neurology
